# Patient Record
Sex: Female | Born: 1960 | Race: White | Hispanic: No | Marital: Married | Smoking: Former smoker
Health system: Southern US, Community
[De-identification: ages and names within clinical notes are randomized; demographics above are authoritative.]

## PROBLEM LIST (undated history)

## (undated) DIAGNOSIS — F419 Anxiety disorder, unspecified: Secondary | ICD-10-CM

## (undated) DIAGNOSIS — J302 Other seasonal allergic rhinitis: Secondary | ICD-10-CM

## (undated) DIAGNOSIS — H35042 Retinal micro-aneurysms, unspecified, left eye: Secondary | ICD-10-CM

## (undated) DIAGNOSIS — M810 Age-related osteoporosis without current pathological fracture: Secondary | ICD-10-CM

## (undated) DIAGNOSIS — D320 Benign neoplasm of cerebral meninges: Secondary | ICD-10-CM

## (undated) DIAGNOSIS — M1990 Unspecified osteoarthritis, unspecified site: Secondary | ICD-10-CM

## (undated) DIAGNOSIS — E7800 Pure hypercholesterolemia, unspecified: Secondary | ICD-10-CM

## (undated) DIAGNOSIS — H3589 Other specified retinal disorders: Secondary | ICD-10-CM

## (undated) HISTORY — DX: Unspecified osteoarthritis, unspecified site: M19.90

## (undated) HISTORY — DX: Anxiety disorder, unspecified: F41.9

## (undated) HISTORY — DX: Retinal micro-aneurysms, unspecified, left eye: H35.042

## (undated) HISTORY — DX: Other seasonal allergic rhinitis: J30.2

## (undated) HISTORY — DX: Age-related osteoporosis without current pathological fracture: M81.0

## (undated) HISTORY — DX: Benign neoplasm of cerebral meninges: D32.0

## (undated) HISTORY — DX: Pure hypercholesterolemia, unspecified: E78.00

---
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
## 1982-11-14 HISTORY — PX: OTHER SURGICAL HISTORY: SHX169

## 1998-06-17 ENCOUNTER — Other Ambulatory Visit: Admission: RE | Admit: 1998-06-17 | Discharge: 1998-06-17 | Payer: Self-pay | Admitting: *Deleted

## 1999-09-16 ENCOUNTER — Other Ambulatory Visit: Admission: RE | Admit: 1999-09-16 | Discharge: 1999-09-16 | Payer: Self-pay | Admitting: *Deleted

## 1999-12-10 ENCOUNTER — Other Ambulatory Visit: Admission: RE | Admit: 1999-12-10 | Discharge: 1999-12-10 | Payer: Self-pay | Admitting: *Deleted

## 1999-12-10 ENCOUNTER — Encounter (INDEPENDENT_AMBULATORY_CARE_PROVIDER_SITE_OTHER): Payer: Self-pay | Admitting: Specialist

## 2001-01-25 ENCOUNTER — Other Ambulatory Visit: Admission: RE | Admit: 2001-01-25 | Discharge: 2001-01-25 | Payer: Self-pay | Admitting: *Deleted

## 2002-02-21 ENCOUNTER — Other Ambulatory Visit: Admission: RE | Admit: 2002-02-21 | Discharge: 2002-02-21 | Payer: Self-pay | Admitting: *Deleted

## 2003-03-06 ENCOUNTER — Other Ambulatory Visit: Admission: RE | Admit: 2003-03-06 | Discharge: 2003-03-06 | Payer: Self-pay | Admitting: *Deleted

## 2004-05-19 ENCOUNTER — Other Ambulatory Visit: Admission: RE | Admit: 2004-05-19 | Discharge: 2004-05-19 | Payer: Self-pay | Admitting: *Deleted

## 2005-07-14 ENCOUNTER — Other Ambulatory Visit: Admission: RE | Admit: 2005-07-14 | Discharge: 2005-07-14 | Payer: Self-pay | Admitting: *Deleted

## 2005-10-12 ENCOUNTER — Ambulatory Visit: Payer: Self-pay | Admitting: Internal Medicine

## 2005-10-19 ENCOUNTER — Ambulatory Visit: Payer: Self-pay | Admitting: Internal Medicine

## 2006-07-19 ENCOUNTER — Other Ambulatory Visit: Admission: RE | Admit: 2006-07-19 | Discharge: 2006-07-19 | Payer: Self-pay | Admitting: *Deleted

## 2006-08-28 ENCOUNTER — Other Ambulatory Visit: Admission: RE | Admit: 2006-08-28 | Discharge: 2006-08-28 | Payer: Self-pay | Admitting: *Deleted

## 2007-03-07 ENCOUNTER — Other Ambulatory Visit: Admission: RE | Admit: 2007-03-07 | Discharge: 2007-03-07 | Payer: Self-pay | Admitting: *Deleted

## 2007-04-04 ENCOUNTER — Ambulatory Visit: Payer: Self-pay | Admitting: Internal Medicine

## 2007-04-04 LAB — CONVERTED CEMR LAB
ALT: 9 units/L (ref 0–40)
AST: 19 units/L (ref 0–37)
Albumin: 3.6 g/dL (ref 3.5–5.2)
Alkaline Phosphatase: 40 units/L (ref 39–117)
BUN: 7 mg/dL (ref 6–23)
Basophils Absolute: 0 10*3/uL (ref 0.0–0.1)
Basophils Relative: 0.2 % (ref 0.0–1.0)
Bilirubin, Direct: 0.1 mg/dL (ref 0.0–0.3)
CO2: 27 meq/L (ref 19–32)
Calcium: 8.7 mg/dL (ref 8.4–10.5)
Chloride: 112 meq/L (ref 96–112)
Cholesterol: 215 mg/dL (ref 0–200)
Creatinine, Ser: 0.7 mg/dL (ref 0.4–1.2)
Direct LDL: 140.2 mg/dL
Eosinophils Absolute: 0.1 10*3/uL (ref 0.0–0.6)
Eosinophils Relative: 1.8 % (ref 0.0–5.0)
GFR calc Af Amer: 116 mL/min
GFR calc non Af Amer: 96 mL/min
Glucose, Bld: 90 mg/dL (ref 70–99)
HCT: 36.2 % (ref 36.0–46.0)
HDL: 53.4 mg/dL (ref >39.0)
Hemoglobin: 12.4 g/dL (ref 12.0–15.0)
Lymphocytes Relative: 20.1 % (ref 12.0–46.0)
MCHC: 34.4 g/dL (ref 30.0–36.0)
MCV: 88.5 fL (ref 78.0–100.0)
Monocytes Absolute: 0.5 10*3/uL (ref 0.2–0.7)
Monocytes Relative: 6.6 % (ref 3.0–11.0)
Neutro Abs: 5.4 10*3/uL (ref 1.4–7.7)
Neutrophils Relative %: 71.3 % (ref 43.0–77.0)
Platelets: 320 10*3/uL (ref 150–400)
Potassium: 4 meq/L (ref 3.5–5.1)
RBC: 4.09 M/uL (ref 3.87–5.11)
RDW: 12.2 % (ref 11.5–14.6)
Sodium: 143 meq/L (ref 135–145)
TSH: 0.78 microintl units/mL (ref 0.35–5.50)
Total Bilirubin: 0.7 mg/dL (ref 0.3–1.2)
Total CHOL/HDL Ratio: 4
Total Protein: 6.3 g/dL (ref 6.0–8.3)
Triglycerides: 80 mg/dL (ref 0–149)
VLDL: 16 mg/dL (ref 0–40)
WBC: 7.5 10*3/uL (ref 4.5–10.5)

## 2007-04-11 ENCOUNTER — Ambulatory Visit: Payer: Self-pay | Admitting: Internal Medicine

## 2007-08-07 ENCOUNTER — Other Ambulatory Visit: Admission: RE | Admit: 2007-08-07 | Discharge: 2007-08-07 | Payer: Self-pay | Admitting: *Deleted

## 2008-07-15 ENCOUNTER — Telehealth: Payer: Self-pay | Admitting: Internal Medicine

## 2008-08-11 ENCOUNTER — Other Ambulatory Visit: Admission: RE | Admit: 2008-08-11 | Discharge: 2008-08-11 | Payer: Self-pay | Admitting: Obstetrics & Gynecology

## 2012-09-27 ENCOUNTER — Encounter: Payer: Self-pay | Admitting: Internal Medicine

## 2012-11-12 ENCOUNTER — Encounter: Payer: Self-pay | Admitting: Internal Medicine

## 2012-11-12 ENCOUNTER — Ambulatory Visit (AMBULATORY_SURGERY_CENTER): Payer: Commercial Managed Care - PPO | Admitting: *Deleted

## 2012-11-12 VITALS — Ht 67.0 in | Wt 139.8 lb

## 2012-11-12 DIAGNOSIS — Z1211 Encounter for screening for malignant neoplasm of colon: Secondary | ICD-10-CM

## 2012-11-12 MED ORDER — MOVIPREP 100 G PO SOLR: ORAL | Status: DC

## 2012-11-14 HISTORY — PX: COLONOSCOPY: SHX174

## 2012-11-23 ENCOUNTER — Encounter: Payer: Self-pay | Admitting: Internal Medicine

## 2012-12-03 ENCOUNTER — Ambulatory Visit (AMBULATORY_SURGERY_CENTER): Payer: Commercial Managed Care - PPO | Admitting: Internal Medicine

## 2012-12-03 ENCOUNTER — Encounter: Payer: Self-pay | Admitting: Internal Medicine

## 2012-12-03 VITALS — BP 122/75 | HR 50 | Temp 98.5°F | Resp 19 | Ht 67.0 in | Wt 139.0 lb

## 2012-12-03 DIAGNOSIS — Z1211 Encounter for screening for malignant neoplasm of colon: Secondary | ICD-10-CM

## 2012-12-03 MED ORDER — SODIUM CHLORIDE 0.9 % IV SOLN: 500.0000 mL | INTRAVENOUS | Status: DC

## 2012-12-03 NOTE — Progress Notes (Signed)
Patient did not experience any of the following events: a burn prior to discharge; a fall within the facility; wrong site/side/patient/procedure/implant event; or a hospital transfer or hospital admission upon discharge from the facility. (G8907) Patient did not have preoperative order for IV antibiotic SSI prophylaxis. (G8918)  

## 2012-12-03 NOTE — Patient Instructions (Addendum)
YOU HAD AN ENDOSCOPIC PROCEDURE TODAY AT THE [facility]: Refer to the procedure report that was given to you for any specific questions about what was found during the examination.  If the procedure report does not answer your questions, please call your gastroenterologist to clarify.  If you requested that your care partner not be given the details of your procedure findings, then the procedure report has been included in a sealed envelope for you to review at your convenience later.  YOU SHOULD EXPECT: Some feelings of bloating in the abdomen. Passage of more gas than usual.  Walking can help get rid of the air that was put into your GI tract during the procedure and reduce the bloating. If you had a lower endoscopy (such as a colonoscopy or flexible sigmoidoscopy) you may notice spotting of blood in your stool or on the toilet paper. If you underwent a bowel prep for your procedure, then you may not have a normal bowel movement for a few days.  DIET: Your first meal following the procedure should be a light meal and then it is ok to progress to your normal diet.  A half-sandwich or bowl of soup is an example of a good first meal.  Heavy or fried foods are harder to digest and may make you feel nauseous or bloated.  Likewise meals heavy in dairy and vegetables can cause extra gas to form and this can also increase the bloating.  Drink plenty of fluids but you should avoid alcoholic beverages for 24 hours.  ACTIVITY: Your care partner should take you home directly after the procedure.  You should plan to take it easy, moving slowly for the rest of the day.  You can resume normal activity the day after the procedure however you should NOT DRIVE or use heavy machinery for 24 hours (because of the sedation medicines used during the test).    SYMPTOMS TO REPORT IMMEDIATELY: A gastroenterologist can be reached at any hour.  During normal business hours, 8:30 AM to 5:00 PM Monday through Friday,  call [phone].  After hours and on weekends, please call the GI answering service at [phone] who will take a message and have the physician on call contact you.   Following lower endoscopy (colonoscopy or flexible sigmoidoscopy):  Excessive amounts of blood in the stool  Significant tenderness or worsening of abdominal pains  Swelling of the abdomen that is new, acute  Fever of 100F or higher    FOLLOW UP: If any biopsies were taken you will be contacted by phone or by letter within the next 1-3 weeks.  Call your gastroenterologist if you have not heard about the biopsies in 3 weeks.  Our staff will call the home number listed on your records the next business day following your procedure to check on you and address any questions or concerns that you may have at that time regarding the information given to you following your procedure. This is a courtesy call and so if there is no answer at the home number and we have not heard from you through the emergency physician on call, we will assume that you have returned to your regular daily activities without incident.  SIGNATURES/CONFIDENTIALITY: You and/or your care partner have signed paperwork which will be entered into your electronic medical record.  These signatures attest to the fact that that the information above on your After Visit Summary has been reviewed and is understood.  Full responsibility of the confidentiality   of this discharge information lies with you and/or your care-partner.     

## 2012-12-03 NOTE — Op Note (Signed)
[facility] [address] [place], [address]   COLONOSCOPY PROCEDURE REPORT  PATIENT: Suzanne Carter, Suzanne Carter  MR#: [id number] BIRTHDATE: 1961/02/28 , 51  yrs. old GENDER: Female ENDOSCOPIST: Roxy Cedar, MD REFERRED VO:[id number] Avva, M.D. PROCEDURE DATE:  12/03/2012 PROCEDURE:   Colonoscopy, screening ASA CLASS:   Class II INDICATIONS:average risk screening. MEDICATIONS: MAC sedation, administered by CRNA and propofol (Diprivan) 250mg  IV  DESCRIPTION OF PROCEDURE:   After the risks benefits and alternatives of the procedure were thoroughly explained, informed consent was obtained.  A digital rectal exam revealed no abnormalities of the rectum.   The LB [id number]-[id number]  endoscope was introduced through the anus and advanced to the cecum, which was identified by both the appendix and ileocecal valve. No adverse events experienced.   The quality of the prep was excellent, using MoviPrep  The instrument was then slowly withdrawn as the colon was fully examined.      COLON FINDINGS: The mucosa appeared normal in the terminal ileum. A normal appearing cecum, ileocecal valve, and appendiceal orifice were identified.  The ascending, hepatic flexure, transverse, splenic flexure, descending, sigmoid colon and rectum appeared unremarkable.  No polyps or cancers were seen.  Retroflexed views revealed no abnormalities. The time to cecum=5 minutes 0 seconds. Withdrawal time=11 minutes 40 seconds.  The scope was withdrawn and the procedure completed. COMPLICATIONS: There were no complications.  ENDOSCOPIC IMPRESSION: 1.   Normal mucosa in the terminal ileum 2.   Normal colon  RECOMMENDATIONS: 1. Continue current colorectal screening recommendations for "routine risk" patients with a repeat colonoscopy in 10 years.   eSigned:  Roxy Cedar, MD 12/03/2012 10:40 AM   cc: Chilton Greathouse, MD and The Patient

## 2012-12-04 ENCOUNTER — Telehealth: Payer: Self-pay | Admitting: *Deleted

## 2012-12-04 NOTE — Telephone Encounter (Signed)
Left message on f/u callback 

## 2013-11-25 ENCOUNTER — Encounter: Payer: Self-pay | Admitting: Nurse Practitioner

## 2013-11-25 ENCOUNTER — Ambulatory Visit (INDEPENDENT_AMBULATORY_CARE_PROVIDER_SITE_OTHER): Payer: Managed Care, Other (non HMO) | Admitting: Nurse Practitioner

## 2013-11-25 VITALS — BP 120/70 | HR 60 | Ht 67.0 in | Wt 140.0 lb

## 2013-11-25 DIAGNOSIS — Z01419 Encounter for gynecological examination (general) (routine) without abnormal findings: Secondary | ICD-10-CM

## 2013-11-25 MED ORDER — NORGESTIMATE-ETH ESTRADIOL 0.25-35 MG-MCG PO TABS: 1.0000 | ORAL_TABLET | Freq: Every day | ORAL | Status: DC

## 2013-11-25 MED ORDER — ESTRADIOL 0.1 MG/24HR TD PTTW: 1.0000 | MEDICATED_PATCH | TRANSDERMAL | Status: DC

## 2013-11-25 NOTE — Patient Instructions (Signed)

## 2013-11-25 NOTE — Progress Notes (Signed)
Patient ID: Suzanne Carter, female   DOB: [birth year], 53 y.o.   MRN: [id number] 52 y.o. G21P2002 Married Caucasian Fe here for annual exam.  Takes continuous OCP and has a withdrawal bleed every 4-5 months.  On off week will use Vivelle patch X 2 doses.  When gets withdrawal menses at 5 days moderate to light.  Patient's last menstrual period was 08/26/2013.          Sexually active: yes  The current method of family planning is OCP (estrogen/progesterone).    Exercising: yes  Gym/ health club routine includes cardio and light weights.  Twice weekly. Smoker:  no  Health Maintenance: Pap:  11/20/12, WNL, neg HR HPV MMG:  09/10/13, Bi-Rads 1: negative Colonoscopy:  12/03/12, normal, repeat in 10 years BMD:   Never, scheduled for 12/2013 TDaP:  2008  Labs: PCP   reports that she quit smoking about 19 years ago. She has never used smokeless tobacco. She reports that she drinks alcohol. She reports that she does not use illicit drugs.  Past Medical History  Diagnosis Date  . Seasonal allergies     Past Surgical History  Procedure Laterality Date  . Wisdom teeth removal  1984  . Tonsillectomy  1977    Current Outpatient Prescriptions  Medication Sig Dispense Refill  . Cetirizine HCl (KLS ALLER-TEC PO) Take by mouth daily.      . Cholecalciferol (VITAMIN D) 2000 UNITS CAPS Take by mouth daily.      . norgestimate-ethinyl estradiol (ORTHO-CYCLEN,SPRINTEC,PREVIFEM) 0.25-35 MG-MCG tablet Take 1 tablet by mouth daily.      . Omega-3 Fatty Acids (OMEGA 3 PO) Take by mouth.       No current facility-administered medications for this visit.    Family History  Problem Relation Age of Onset  . Colon cancer Neg Hx   . Stomach cancer Neg Hx   . Heart disease Father   . Cancer Father   . Breast cancer Sister     lung  . Osteoporosis Mother     ROS:  Pertinent items are noted in HPI.  Otherwise, a comprehensive ROS was negative.  Exam:   BP 120/70  Pulse 60  Ht 5\' 7"  (1.702 m)  Wt 140  lb (63.504 kg)  BMI 21.92 kg/m2  LMP 08/26/2013 Height: 5\' 7"  (170.2 cm)  Ht Readings from Last 3 Encounters:  11/25/13 5\' 7"  (1.702 m)  12/03/12 5\' 7"  (1.702 m)  11/12/12 5\' 7"  (1.702 m)    General appearance: alert, cooperative and appears stated age Head: Normocephalic, without obvious abnormality, atraumatic Neck: no adenopathy, supple, symmetrical, trachea midline and thyroid normal to inspection and palpation Lungs: clear to auscultation bilaterally Breasts: normal appearance, no masses or tenderness Heart: regular rate and rhythm Abdomen: soft, non-tender; no masses,  no organomegaly Extremities: extremities normal, atraumatic, no cyanosis or edema Skin: Skin color, texture, turgor normal. No rashes or lesions Lymph nodes: Cervical, supraclavicular, and axillary nodes normal. No abnormal inguinal nodes palpated Neurologic: Grossly normal   Pelvic: External genitalia:  no lesions              Urethra:  normal appearing urethra with no masses, tenderness or lesions              Bartholin's and Skene's: normal                 Vagina: normal appearing vagina with normal color and discharge, no lesions  Cervix: anteverted              Pap taken: no Bimanual Exam:  Uterus:  normal size, contour, position, consistency, mobility, non-tender              Adnexa: no mass, fullness, tenderness               Rectovaginal: Confirms               Anus:  normal sphincter tone, no lesions  A:  Well Woman with normal exam   OCP for cycle regulation and headaches  Husband with vasectomy  P:   Pap smear as per guidelines   Mammogram due 10/15  Refill Vivelle dot and OCP for a year  Plan is to come off OCP in 1 1/2 year.  Counseled on breast self exam, mammography screening, adequate intake of calcium and vitamin D, diet and exercise return annually or prn  An After Visit Summary was printed and given to the patient.

## 2013-11-28 NOTE — Progress Notes (Signed)
Encounter reviewed by Dr. Samin Milke Silva.  

## 2014-01-02 ENCOUNTER — Other Ambulatory Visit: Payer: Self-pay | Admitting: Nurse Practitioner

## 2014-01-02 ENCOUNTER — Telehealth: Payer: Self-pay | Admitting: Nurse Practitioner

## 2014-01-02 NOTE — Telephone Encounter (Signed)
Patient sent a list of now generic OCP for her new insurance company.  I called her to discuss and she had an off week of OCP on 2/6 and did not have a withdrawal bleed.  Since she did not have a menses she may not even need OCP (used for menorrhagia not birth control).  So we discussed going back off OCP after this current pack of pills and see how she does with an irregular bleeding, increase in HA's or other symptoms.  If she does have problems the Necon 0.5/35 is probably the closest to what she is on now and we can send in new RX.  She will give it some thought and time and see how she does. The list of OCP is on her paper chart.

## 2014-02-13 ENCOUNTER — Other Ambulatory Visit: Payer: Self-pay | Admitting: Nurse Practitioner

## 2014-02-13 ENCOUNTER — Telehealth: Payer: Self-pay | Admitting: Nurse Practitioner

## 2014-02-13 MED ORDER — NORETHINDRONE-ETH ESTRADIOL 0.5-35 MG-MCG PO TABS: 1.0000 | ORAL_TABLET | Freq: Every day | ORAL | Status: DC

## 2014-02-13 NOTE — Telephone Encounter (Signed)
Yes she can restart on Necon 0.5/30.  I will place the order. Thanks

## 2014-02-13 NOTE — Telephone Encounter (Signed)
Reviewed last telephone encounter from 2/19 with Milford Cage, [place]. Patient was to stop taking OCP to see how she does with irregular bleeding. Patient's LMP 3/9 which lasted a week but she has now started having some spotting. Milford Cage, FNP recommended Necon 0.5/35 if she had any problems.  Milford Cage, FNP, would you like me to place order for Necon 0.5/35 at this time?

## 2014-02-13 NOTE — Telephone Encounter (Signed)
Spoke with patient. Advised she can start Necon 0.5/30 and that [facility]. Patient agreeable and verbalizes understanding.  Routing to provider for final review. Patient agreeable to disposition. Will close encounter

## 2014-02-13 NOTE — Telephone Encounter (Signed)
Patient is calling patty to let her know what her cycle started 01/20/14 and lasted a week. And has started spotting this week. Said patty was goning give her a different pill

## 2014-05-13 ENCOUNTER — Telehealth: Payer: Self-pay

## 2014-05-13 NOTE — Telephone Encounter (Signed)
Pt has enough refill until her next AEX.  Encounter closed

## 2014-05-14 ENCOUNTER — Telehealth: Payer: Self-pay

## 2014-05-14 MED ORDER — NORETHINDRONE-ETH ESTRADIOL 0.5-35 MG-MCG PO TABS: 1.0000 | ORAL_TABLET | Freq: Every day | ORAL | Status: DC

## 2014-05-14 NOTE — Telephone Encounter (Signed)
Received a fax from [facility] for [facility] refills.  Last refill: 02/13/14 dispense 3 packs 3 refill Pt had enough to cover until next AEX in 11/2014 Called pharm to make sure pt had enough refills because I received a fax form pharm yesterday. [facility] stated her INS only covers for a 1 mth supply at a time. [facility] to cover [facility] until next AEX  Encounter closed

## 2014-09-15 ENCOUNTER — Encounter: Payer: Self-pay | Admitting: Nurse Practitioner

## 2014-11-26 ENCOUNTER — Encounter: Payer: Self-pay | Admitting: Nurse Practitioner

## 2014-11-26 ENCOUNTER — Ambulatory Visit (INDEPENDENT_AMBULATORY_CARE_PROVIDER_SITE_OTHER): Payer: 59 | Admitting: Nurse Practitioner

## 2014-11-26 VITALS — BP 136/78 | HR 60 | Ht 67.0 in | Wt 142.0 lb

## 2014-11-26 DIAGNOSIS — Z01419 Encounter for gynecological examination (general) (routine) without abnormal findings: Secondary | ICD-10-CM

## 2014-11-26 DIAGNOSIS — Z0000 Encounter for general adult medical examination without abnormal findings: Secondary | ICD-10-CM

## 2014-11-26 MED ORDER — NORETHINDRONE-ETH ESTRADIOL 0.5-35 MG-MCG PO TABS: 1.0000 | ORAL_TABLET | Freq: Every day | ORAL | Status: DC

## 2014-11-26 MED ORDER — ESTRADIOL 0.1 MG/24HR TD PTTW: 1.0000 | MEDICATED_PATCH | TRANSDERMAL | Status: DC

## 2014-11-26 NOTE — Patient Instructions (Signed)

## 2014-11-26 NOTE — Progress Notes (Signed)
Patient ID: Suzanne Carter, female   DOB: Mar 25, 1961, 54 y.o.   MRN: [id number] 53 y.o. G2P2002 Married  Caucasian Fe here for annual exam.  No menses since October. No recent headaches.  Usually off OCP the headaches will start and be very severe.  Really reluctant to come off OCP.  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  Patient's last menstrual period was 08/14/2014 (approximate).          Sexually active: yes  The current method of family planning is OCP (estrogen/progesterone).  Exercising: yes Gym/ health club routine includes cardio and light weights. Twice weekly. Smoker: no  Health Maintenance: Pap: 11/20/12, WNL, neg HR HPV MMG: 09/15/14, Bi-Rads 1: negative Colonoscopy: 12/03/12, normal, repeat in 10 years BMD:09/2014, Dr. Radene Gunning, normal TDaP: 2008 Labs: PCP   reports that she quit smoking about 20 years ago. Her smoking use included Cigarettes. She has a 3.5 pack-year smoking history. She has never used smokeless tobacco. She reports that she drinks alcohol. She reports that she does not use illicit drugs.  Past Medical History  Diagnosis Date  . Seasonal allergies     Past Surgical History  Procedure Laterality Date  . Wisdom teeth removal  1984  . Tonsillectomy  1977    Current Outpatient Prescriptions  Medication Sig Dispense Refill  . Cholecalciferol (VITAMIN D) 2000 UNITS CAPS Take by mouth daily.    Marland Kitchen estradiol (VIVELLE-DOT) 0.1 MG/24HR patch Place 1 patch (0.1 mg total) onto the skin 2 (two) times a week. 8 patch 12  . norethindrone-ethinyl estradiol (NECON,BREVICON,MODICON) 0.5-35 MG-MCG tablet Take 1 tablet by mouth daily. 3 Package 3  . Omega-3 Fatty Acids (OMEGA 3 PO) Take by mouth.    . SUMAtriptan (IMITREX) 100 MG tablet Take 100 mg by mouth daily as needed.  11   No current facility-administered medications for this visit.     Family History  Problem Relation Age of Onset  . Colon cancer Neg Hx   . Stomach cancer Neg Hx   . Heart disease Father   . Lung cancer Father 1  . Osteoporosis Mother   . Diverticulitis Brother   . Ovarian cancer Paternal Aunt   . Ovarian cancer Paternal Aunt   . Cancer Maternal Grandfather     esophageal cancer  . Lung cancer Paternal Grandfather     ROS:  Pertinent items are noted in HPI.  Otherwise, a comprehensive ROS was negative.  Exam:   BP 136/78 mmHg  Pulse 60  Ht 5\' 7"  (1.702 m)  Wt 142 lb (64.411 kg)  BMI 22.24 kg/m2  LMP 08/14/2014 (Approximate) Height: 5\' 7"  (170.2 cm) Ht Readings from Last 3 Encounters:  11/26/14 5\' 7"  (1.702 m)  11/25/13 5\' 7"  (1.702 m)  12/03/12 5\' 7"  (1.702 m)    General appearance: alert, cooperative and appears stated age Head: Normocephalic, without obvious abnormality, atraumatic Neck: no adenopathy, supple, symmetrical, trachea midline and thyroid normal to inspection and palpation Lungs: clear to auscultation bilaterally Breasts: normal appearance, no masses or tenderness Heart: regular rate and rhythm Abdomen: soft, non-tender; no masses,  no organomegaly Extremities: extremities normal, atraumatic, no cyanosis or edema Skin: Skin color, texture, turgor normal. No rashes or lesions Lymph nodes: Cervical, supraclavicular, and axillary nodes normal. No abnormal inguinal nodes palpated Neurologic: Grossly normal   Pelvic: External genitalia:  no lesions  Urethra:  normal appearing urethra with no masses, tenderness or lesions              Bartholin's and Skene's: normal                 Vagina: normal appearing vagina with normal color and discharge, no lesions              Cervix: anteverted              Pap taken: No. Bimanual Exam:  Uterus:  normal size, contour, position, consistency, mobility, non-tender              Adnexa: no mass, fullness, tenderness               Rectovaginal: Confirms                Anus:  normal sphincter tone, no lesions  Chaperone present: no  A:  Well Woman with normal exam  OCP for cycle regulation and headaches Husband with vasectomy   P:   Reviewed health and wellness pertinent to exam  Pap smear not taken today  Mammogram is due 11/16  Refill OCP for a year  Refill Vivelle dot for a year  Counseled on breast self exam, mammography screening, use and side effects of OCP's, adequate intake of calcium and vitamin D, diet and exercise return annually or prn  An After Visit Summary was printed and given to the patient.

## 2014-11-27 NOTE — Progress Notes (Signed)
OK to continue OCPs but would lower to 69mcg dosage.  Reviewed personally.  Felipa Emory, MD.

## 2014-11-28 ENCOUNTER — Telehealth: Payer: Self-pay | Admitting: *Deleted

## 2014-11-28 NOTE — Telephone Encounter (Signed)
I spoke to the patient and notified of need to decrease dose to 20 mcg pill per Dr. Sabra Heck and Edman Circle.  Pt is agreeable to this plan. Pt states current OCP is free with her insurance. Pt will fax a list to my attention of free OCP options. Advised pt I will be out of the office this afternoon and Monday, but will make Edman Circle, FNP aware fax is coming so we can send in new RX.  Advised pt we will call her if we have any questions or problems.  Pt has two weeks of pills left in current pill pack.  [facility].  Please enter new RX when fax is received.

## 2014-12-01 ENCOUNTER — Other Ambulatory Visit: Payer: Self-pay | Admitting: Nurse Practitioner

## 2014-12-01 MED ORDER — NORETHIN ACE-ETH ESTRAD-FE 1-20 MG-MCG PO TABS: 1.0000 | ORAL_TABLET | Freq: Every day | ORAL | Status: DC

## 2014-12-01 NOTE — Telephone Encounter (Signed)
Did you call her?

## 2014-12-01 NOTE — Telephone Encounter (Signed)
According to telephone note (see below) patient was to be notified as soon as rx had been sent in after fax was sent to our office with different BC options that would be free with her insurance.  Graylon Good, CMA at 11/28/2014 9:20 AM     Status: Signed       Expand All Collapse All   I spoke to the patient and notified of need to decrease dose to 20 mcg pill per Dr. Sabra Heck and Edman Circle. Pt is agreeable to this plan. Pt states current OCP is free with her insurance. Pt will fax a list to my attention of free OCP options. Advised pt I will be out of the office this afternoon and Monday, but will make Edman Circle, FNP aware fax is coming so we can send in new RX. Advised pt we will call her if we have any questions or problems.  Pt has two weeks of pills left in current pill pack.  [facility].  Please enter new RX when fax is received      Ms. Patty sent in Norethindrone-ethinyl-estradiol 1-20 mcg  ([employer] [place], [place], Loestrin) #3 packs[facility], patient is aware.  Routed to provider for review, encounter closed.

## 2014-12-02 NOTE — Telephone Encounter (Signed)
Yes ma'am I called and notified her yesterday.

## 2015-12-01 ENCOUNTER — Ambulatory Visit (INDEPENDENT_AMBULATORY_CARE_PROVIDER_SITE_OTHER): Payer: 59 | Admitting: Nurse Practitioner

## 2015-12-01 ENCOUNTER — Encounter: Payer: Self-pay | Admitting: Nurse Practitioner

## 2015-12-01 VITALS — BP 130/82 | HR 64 | Ht 67.0 in | Wt 142.0 lb

## 2015-12-01 DIAGNOSIS — N912 Amenorrhea, unspecified: Secondary | ICD-10-CM

## 2015-12-01 DIAGNOSIS — R87619 Unspecified abnormal cytological findings in specimens from cervix uteri: Secondary | ICD-10-CM | POA: Diagnosis not present

## 2015-12-01 DIAGNOSIS — N63 Unspecified lump in breast: Secondary | ICD-10-CM

## 2015-12-01 DIAGNOSIS — Z0000 Encounter for general adult medical examination without abnormal findings: Secondary | ICD-10-CM | POA: Diagnosis not present

## 2015-12-01 DIAGNOSIS — N6310 Unspecified lump in the right breast, unspecified quadrant: Secondary | ICD-10-CM

## 2015-12-01 DIAGNOSIS — Z01419 Encounter for gynecological examination (general) (routine) without abnormal findings: Secondary | ICD-10-CM | POA: Diagnosis not present

## 2015-12-01 NOTE — Progress Notes (Signed)
Patient ID: Suzanne Carter, female   DOB: 04-19-1961, 55 y.o.   MRN: [id number]  55 y.o. G19P2002 Married  Caucasian Fe here for annual exam.  She decided to go off OCP last April after knowing she would be taken off them this year.  She was quite happy that she did not have another menses and most importantly - NO HA's.  She has had a bit of vaso symptoms - at times nights worse than day.  Does have sleep interruptions.  She also has decreased libido and increased vaginal dryness - does use lubrication.  Some increase in muscle and joint pain - PCP has done some labs to R/O connective tissue disorder and so far all is normal. Her best friend age 93 died of colon cancer this past year.  Patient's last menstrual period was 08/14/2014 (approximate).          Sexually active: Yes.    The current method of family planning is post menopausal status.   Exercising: Yes.    treadmill and weights at gym twice weekly Smoker:  no  Health Maintenance: Pap: 11/20/12, WNL, neg HR HPV MMG:09/18/15, Bi-Rads 1: negative Colonoscopy: 12/03/12, normal, repeat in 10 years BMD:09/2014, Dr. Radene Gunning, normal TDaP: 2008 Hep C and HIV: will do today Labs: Dr. Dagmar Hait   reports that she quit smoking about 21 years ago. Her smoking use included Cigarettes. She has a 3.5 pack-year smoking history. She has never used smokeless tobacco. She reports that she drinks alcohol. She reports that she does not use illicit drugs.  Past Medical History  Diagnosis Date  . Seasonal allergies     Past Surgical History  Procedure Laterality Date  . Wisdom teeth removal  1984  . Tonsillectomy  1977    Current Outpatient Prescriptions  Medication Sig Dispense Refill  . Cholecalciferol (VITAMIN D) 2000 UNITS CAPS Take by mouth daily.    . Omega-3 Fatty Acids (OMEGA 3 PO) Take by mouth.    . RESTASIS 0.05 % ophthalmic emulsion Place 1 drop into both eyes 2 (two) times daily.    . SUMAtriptan (IMITREX) 100 MG tablet Take 100 mg by  mouth daily as needed.  11   No current facility-administered medications for this visit.    Family History  Problem Relation Age of Onset  . Colon cancer Neg Hx   . Stomach cancer Neg Hx   . Heart disease Father   . Lung cancer Father 80  . Osteoporosis Mother   . Diverticulitis Brother   . Ovarian cancer Paternal Aunt   . Ovarian cancer Paternal Aunt   . Cancer Maternal Grandfather     esophageal cancer  . Lung cancer Paternal Grandfather     ROS:  Pertinent items are noted in HPI.  Otherwise, a comprehensive ROS was negative.  Exam:   BP 130/82 mmHg  Pulse 64  Ht 5\' 7"  (1.702 m)  Wt 142 lb (64.411 kg)  BMI 22.24 kg/m2  LMP 08/14/2014 (Approximate) Height: 5\' 7"  (170.2 cm) Ht Readings from Last 3 Encounters:  12/01/15 5\' 7"  (1.702 m)  11/26/14 5\' 7"  (1.702 m)  11/25/13 5\' 7"  (1.702 m)    General appearance: alert, cooperative and appears stated age Head: Normocephalic, without obvious abnormality, atraumatic Neck: no adenopathy, supple, symmetrical, trachea midline and thyroid normal to inspection and palpation Lungs: clear to auscultation bilaterally Breasts: normal appearance, no masses or tenderness, positive findings: there is a cluster of FCB changes or mass right breast at 4:00 position.  This is non mobile but not tender or warm.  No lymph nodes.  Left breast with similiar location but they are mobile. Heart: regular rate and rhythm Abdomen: soft, non-tender; no masses,  no organomegaly Extremities: extremities normal, atraumatic, no cyanosis or edema Skin: Skin color, texture, turgor normal. No rashes or lesions Lymph nodes: Cervical, supraclavicular, and axillary nodes normal. No abnormal inguinal nodes palpated Neurologic: Grossly normal   Pelvic: External genitalia:  no lesions              Urethra:  normal appearing urethra with no masses, tenderness or lesions              Bartholin's and Skene's: normal                 Vagina: normal appearing  vagina with normal color and discharge, no lesions              Cervix: anteverted              Pap taken: Yes.   Bimanual Exam:  Uterus:  normal size, contour, position, consistency, mobility, non-tender              Adnexa: no mass, fullness, tenderness               Rectovaginal: Confirms               Anus:  normal sphincter tone, no lesions  Chaperone present: yes  A:  Well Woman with normal exam Husband with vasectomy  Amenorrhea while on OCP - off OCP since 02/2015 with amenorrhea  Tolerable vaso symptoms - does not want HRT    P:   Reviewed health and wellness pertinent to exam  Pap smear as above  Mammogram is due 09/2016 - but will need to get diagnostic Mammo and [place] now to assess the mass on the right.  Will follow with labs  Only brief discussion about HRT and pt is not interested and does not ever want  Counseled on breast self exam, mammography screening, adequate intake of calcium and vitamin D, diet and exercise, Kegel's exercises return annually or prn  An After Visit Summary was printed and given to the patient.

## 2015-12-01 NOTE — Patient Instructions (Addendum)

## 2015-12-02 ENCOUNTER — Telehealth: Payer: Self-pay

## 2015-12-02 DIAGNOSIS — N6310 Unspecified lump in the right breast, unspecified quadrant: Secondary | ICD-10-CM

## 2015-12-02 LAB — HIV ANTIBODY (ROUTINE TESTING W REFLEX): HIV 1&2 Ab, 4th Generation: NONREACTIVE

## 2015-12-02 LAB — HEPATITIS C ANTIBODY: HCV Ab: NEGATIVE

## 2015-12-02 LAB — FOLLICLE STIMULATING HORMONE: FSH: 122.1 m[IU]/mL — ABNORMAL HIGH

## 2015-12-02 NOTE — Telephone Encounter (Signed)
Spoke with [employer]. Right diagnostic mammogram and ultrasound scheduled for 12/03/2015 at 8:15 am. Notified patient of appointment date and time. Patient is agreeable. Placed in mammogram hold.  Routing to provider for final review. Patient agreeable to disposition. Will close encounter.

## 2015-12-02 NOTE — Progress Notes (Signed)
Encounter reviewed by Dr. Brook Amundson C. Silva.  

## 2015-12-07 LAB — IPS PAP TEST WITH HPV

## 2015-12-07 NOTE — Addendum Note (Signed)
Addended by: Antonietta Barcelona on: 12/07/2015 01:34 PM   Modules accepted: Orders

## 2015-12-08 LAB — IPS HPV GENOTYPING 16/18

## 2015-12-11 ENCOUNTER — Telehealth: Payer: Self-pay | Admitting: Emergency Medicine

## 2015-12-11 NOTE — Telephone Encounter (Signed)
Pt is called back and I have discussed the abnormal pap.  She had both pap and HR HPV done in 11/2012 and both were negative.  She now has + HR HPV but the genotype for 16,178, 45 were negative.  She is aware that other illness states could have caused this to now be positive.  She is agreeable to rechecking a pap in 1 year along with HR HPV.

## 2015-12-11 NOTE — Telephone Encounter (Signed)
-----   Message from Kem Boroughs, [place] sent at 12/08/2015  4:58 PM EST ----- Please inform pt that her pap was normal but that HR HPV was positive.  She was then tested for genotype # 16, 18 & 4 and was normal.  She needs another pap in a year. 08 recall. No history of abnormal that I am aware.

## 2015-12-11 NOTE — Telephone Encounter (Signed)
Call to patient. She is given results of normal pap smear and positive HPV.  Patient has concerns regarding infection. Not ever positive for HPV or abnormal pap smear.  Married 35 years with one partner only.   Advised will ask Kem Boroughs, FNP to call patient directly.   Patient agreeable.

## 2016-07-21 ENCOUNTER — Telehealth: Payer: Self-pay | Admitting: *Deleted

## 2016-07-21 DIAGNOSIS — R87810 Cervical high risk human papillomavirus (HPV) DNA test positive: Secondary | ICD-10-CM

## 2016-07-21 NOTE — Telephone Encounter (Signed)
Call to patient. Advised internal audit completed and MD reviewed pap results and colpo recommended. Brief explanation of procedure provided. Colpo scheduled for 07-28-16 with Dr Sabra Heck. Instructed to take Motrin 800 mg one hour prior with food.   Routing to provider for final review. Patient agreeable to disposition. Will close encounter.

## 2016-07-25 ENCOUNTER — Telehealth: Payer: Self-pay | Admitting: Obstetrics & Gynecology

## 2016-07-25 NOTE — Telephone Encounter (Signed)
Patient has an appointment for a colposcopy 07/28/16 and is wondering how she may feel after the procedure.

## 2016-07-25 NOTE — Telephone Encounter (Signed)
Spoke with patient. Patient is scheduled to have a colposcopy on 07/28/2016 at 10 am with Dr.Miller. Patient is asking how she can expect to feel after her procedure. Advised patient following a colposcopy it is not uncommon to have cramping and light bleeding. Advised she may take Ibuprofen/Motrin as needed for cramping if needed following the procedure. Advised light spotting usually lasts for 2-3 days. Advised she may resume her normal daily activities after her procedure, but may want to take it easy if she is able to due to cramping she may experience following the procedure. She is agreeable and verbalizes understanding.  Routing to provider for final review. Patient agreeable to disposition. Will close encounter.

## 2016-07-28 ENCOUNTER — Ambulatory Visit (INDEPENDENT_AMBULATORY_CARE_PROVIDER_SITE_OTHER): Payer: 59 | Admitting: Obstetrics & Gynecology

## 2016-07-28 DIAGNOSIS — R87810 Cervical high risk human papillomavirus (HPV) DNA test positive: Secondary | ICD-10-CM | POA: Diagnosis not present

## 2016-07-28 NOTE — Progress Notes (Signed)
55 y.o. Married Caucasian female here for colposcopy with possible biopsies and/or ECC due to neg Pap with +HR HPV and positive 18/45 testing obtained 12/01/15.  Pt's abnormal finding was initially reported as positive but in our audit, this was noted.  Pt was notified and she is here for recommended colposcopy.    Prior evaluation/treatment:  none.  Patient's last menstrual period was 08/14/2014 (approximate).          Sexually active: Yes.    The current method of family planning is post menopausal status.     Patient has been counseled about results and procedure.  Risks and benefits have bene reviewed including immediate and/or delayed bleeding, infection, cervical scaring from procedure, possibility of needing additional follow up as well as treatment.  rare risks of missing a lesion discussed as well.  All questions answered.  Pt ready to proceed.  BP 124/70 (BP Location: Right Arm, Patient Position: Sitting, Cuff Size: Normal)   Pulse 76   Temp 97.9 F (36.6 C) (Oral)   Resp 16   Ht 5\' 7"  (1.702 m)   Wt 138 lb (62.6 kg)   LMP 08/14/2014 (Approximate)   BMI 21.61 kg/m   Physical Exam  Constitutional: She is oriented to person, place, and time. She appears well-developed and well-nourished.  Genitourinary: Vagina normal. There is no rash, tenderness, lesion or injury on the right labia. There is no rash, tenderness, lesion or injury on the left labia.    Lymphadenopathy:       Right: No inguinal adenopathy present.       Left: No inguinal adenopathy present.  Neurological: She is alert and oriented to person, place, and time.  Skin: Skin is warm and dry.  Psychiatric: She has a normal mood and affect.    Speculum placed.  3% acetic acid applied to cervix for >45 seconds.  Cervix visualized with both 7.5X and 15X magnification.  Green filter also used.  Lugols solution was used.  Findings:  Erythematous lesion at 7-9 o'clock that also has decreased staining with Lugol's.  In  addition, a second lesion with decrease staining noted at 5 o'clock.  Biopsy:  7 and 5 o'clock.  ECC:  was performed.  Monsel's was needed.  Excellent hemostasis was present.  Pt tolerated procedure well and all instruments were removed.  Findings noted above on picture of cervix.  Assessment:  +HR HPV, +18/45 testing  Plan:  Pathology results will be called to patient and follow-up planned pending results.

## 2016-07-28 NOTE — Patient Instructions (Signed)

## 2016-08-01 ENCOUNTER — Other Ambulatory Visit: Payer: Self-pay | Admitting: Optometry

## 2016-08-01 DIAGNOSIS — H0520 Unspecified exophthalmos: Secondary | ICD-10-CM

## 2016-08-01 DIAGNOSIS — H53482 Generalized contraction of visual field, left eye: Secondary | ICD-10-CM

## 2016-08-01 DIAGNOSIS — H3550 Unspecified hereditary retinal dystrophy: Secondary | ICD-10-CM

## 2016-08-01 DIAGNOSIS — H4720 Unspecified optic atrophy: Secondary | ICD-10-CM

## 2016-08-02 ENCOUNTER — Ambulatory Visit (INDEPENDENT_AMBULATORY_CARE_PROVIDER_SITE_OTHER): Payer: 59

## 2016-08-02 ENCOUNTER — Ambulatory Visit: Payer: 59

## 2016-08-02 DIAGNOSIS — H4720 Unspecified optic atrophy: Secondary | ICD-10-CM | POA: Diagnosis not present

## 2016-08-02 DIAGNOSIS — H53482 Generalized contraction of visual field, left eye: Secondary | ICD-10-CM

## 2016-08-02 DIAGNOSIS — H0520 Unspecified exophthalmos: Secondary | ICD-10-CM

## 2016-08-02 DIAGNOSIS — H3550 Unspecified hereditary retinal dystrophy: Secondary | ICD-10-CM

## 2016-08-02 MED ORDER — IOPAMIDOL (ISOVUE-300) INJECTION 61%
80.0000 mL | Freq: Once | INTRAVENOUS | Status: AC | PRN
  Administered 2016-08-02: 80 mL via INTRAVENOUS

## 2016-08-03 ENCOUNTER — Telehealth: Payer: Self-pay | Admitting: Nurse Practitioner

## 2016-08-03 NOTE — Telephone Encounter (Signed)
Spoke with patient. Advised patient of results as seen below by Dr. Sabra Heck. Patient rescheduled AEX for 02/09/16 with Kem Boroughs, NP. 08 recall in place. Patient is agreeable to date and time.   Notes Recorded by Megan Salon, MD on 08/03/2016 at 5:29 AM EDT Please let pt know her biopsies showed only mild dysplasia (no cancer) and her ECC was negative for abnormal cells. She is scheduled for AEX in Jan. I'd like to do her annual in March if she is ok with this and repeat the pap smear then. 08 recall. Please remove from recall. Thanks.   Routing to provider for final review. Patient is agreeable to disposition. Will close encounter.  CC: Kem Boroughs, NP

## 2016-08-03 NOTE — Telephone Encounter (Signed)
Patient is calling to get her results °

## 2016-08-25 ENCOUNTER — Telehealth: Payer: Self-pay | Admitting: Obstetrics & Gynecology

## 2016-09-09 DIAGNOSIS — D320 Benign neoplasm of cerebral meninges: Secondary | ICD-10-CM

## 2016-09-09 HISTORY — DX: Benign neoplasm of cerebral meninges: D32.0

## 2016-09-09 NOTE — Telephone Encounter (Signed)
error 

## 2016-09-14 HISTORY — PX: EYE SURGERY: SHX253

## 2016-09-20 DIAGNOSIS — D320 Benign neoplasm of cerebral meninges: Secondary | ICD-10-CM | POA: Insufficient documentation

## 2016-10-25 LAB — IPS OTHER TISSUE BIOPSY

## 2016-12-06 ENCOUNTER — Ambulatory Visit: Payer: 59 | Admitting: Nurse Practitioner

## 2017-01-08 IMAGING — CT CT HEAD WO/W CM
3 of 7 series · 12 of 47 positions shown, 14 images · IV contrast (APPLIED)
Comparison: None.

CLINICAL DATA: Exophthalmos left eye. Visual field changes left
eye.

EXAM:
CT HEAD AND ORBITS WITHOUT AND WITH CONTRAST
TECHNIQUE: Contiguous axial images were obtained from the base of the skull
through the vertex with and without contrast. Multidetector CT
imaging of the orbits was performed using the standard protocol with
and without intravenous contrast.
CONTRAST:  80mL 47DTXM-LFF IOPAMIDOL (47DTXM-LFF) INJECTION
61%<Contrast>80mL 47DTXM-LFF IOPAMIDOL (47DTXM-LFF) INJECTION 61%

[Series 10: orbits 2.0 coronal · coronal · 0.16mm/px · 3 of 74 slices shown]
[im 19/74  brain]
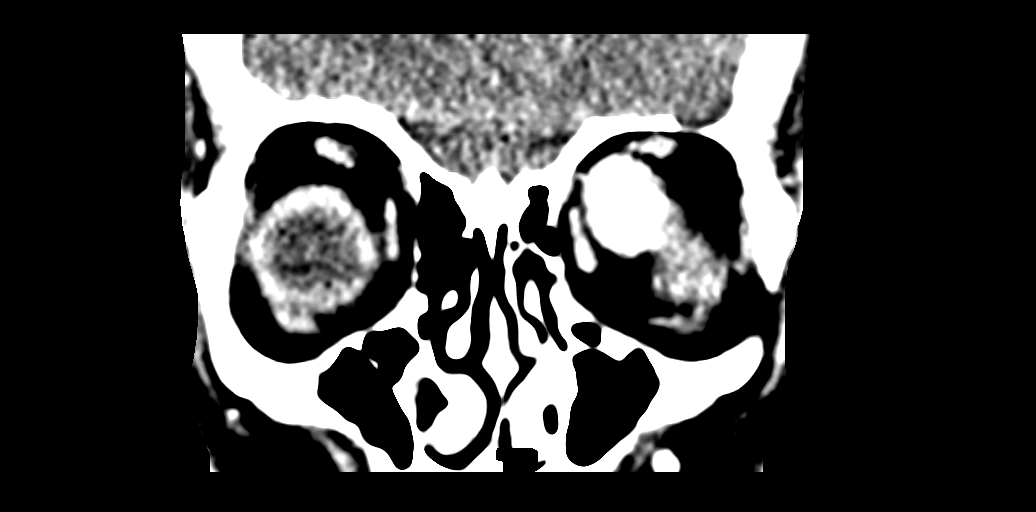
[im 37/74  brain]
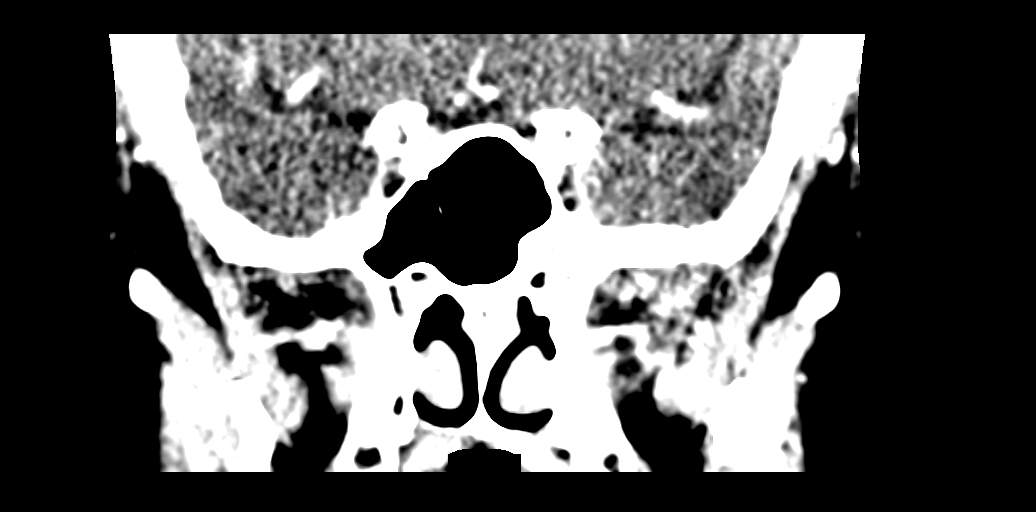
[im 55/74  brain]
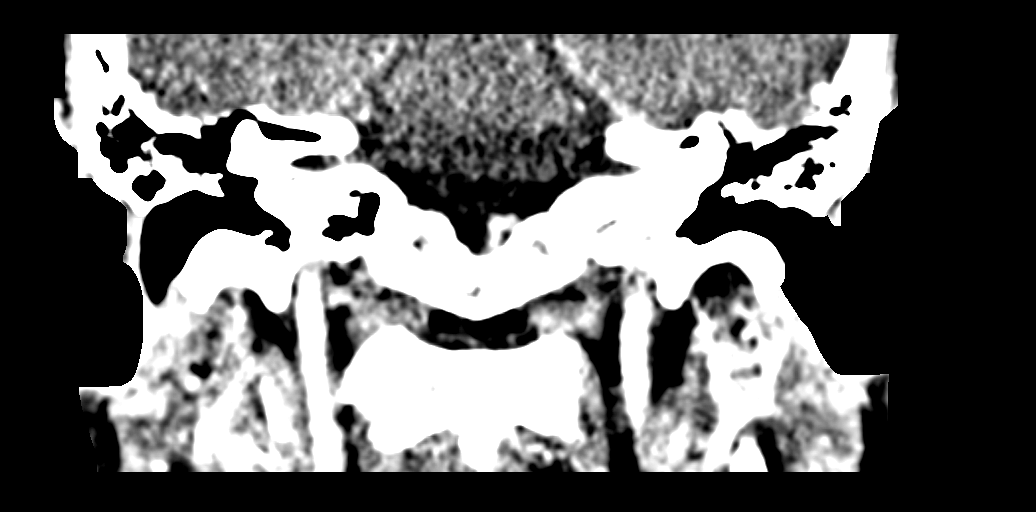

[Series 11: orbits 2.0 sagittal · sagittal · 0.16mm/px · 2 of 78 slices shown]
[im 26/78  brain]
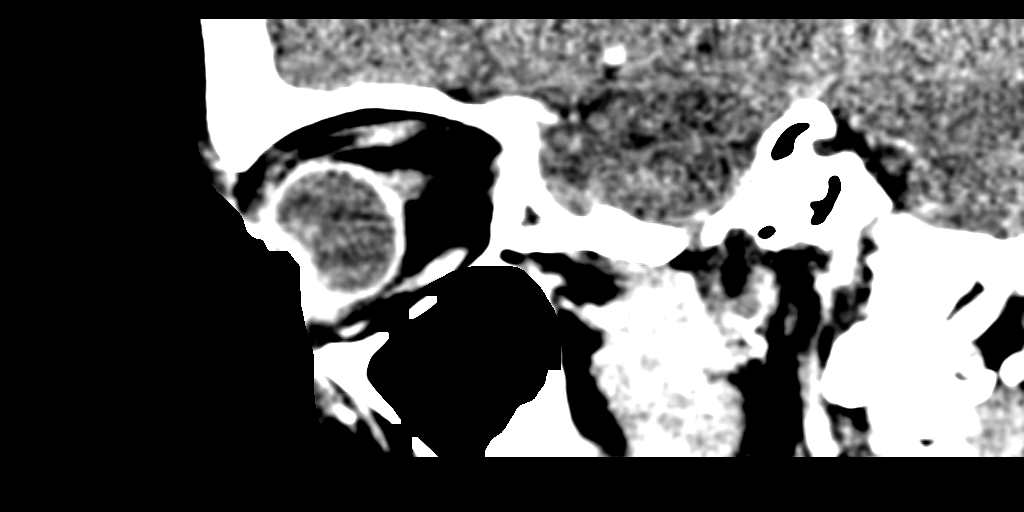
[im 52/78  brain]
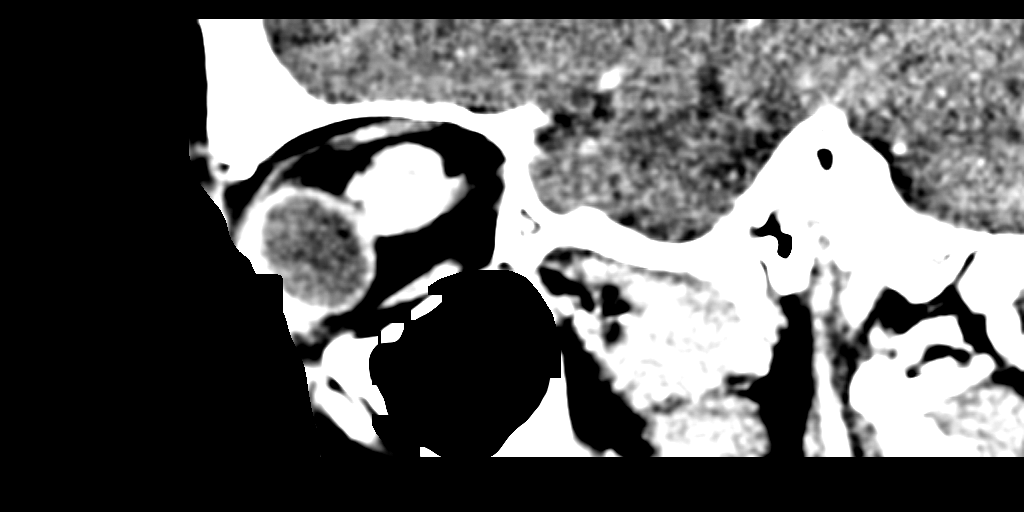

[Series 12: head w soft · axial · 0.39mm/px · z∈[-144,-24]mm · 7 of 32 slices shown, 9 images]
[im 4/32  brain]
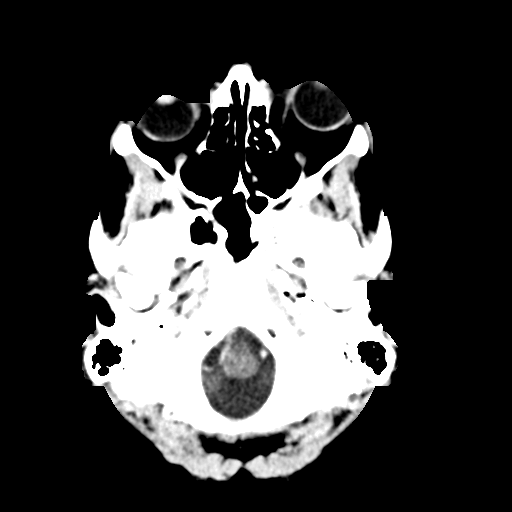
[im 4/32  bone]
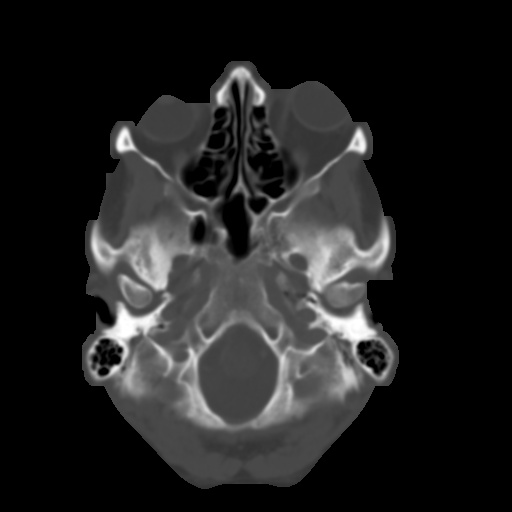
[im 7/32  brain]
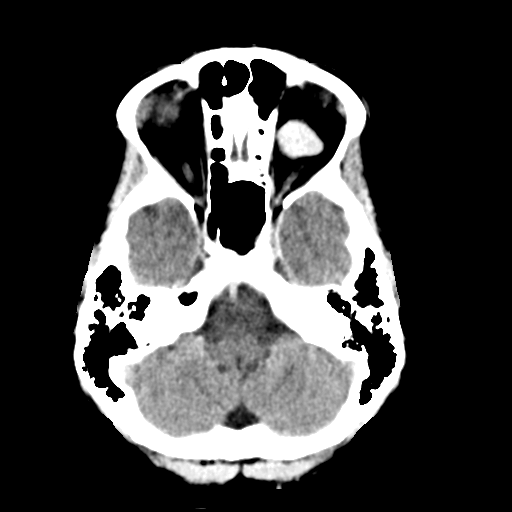
[im 11/32  brain]
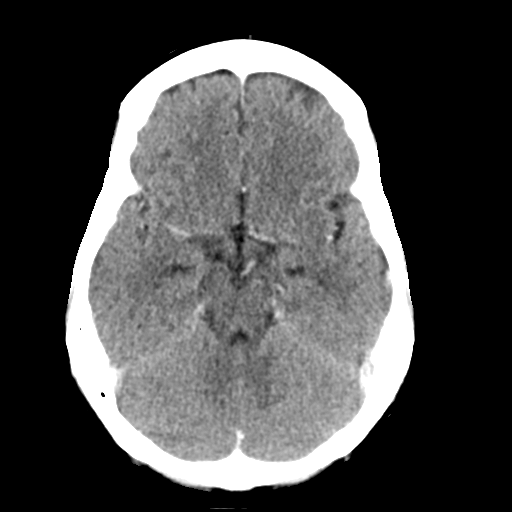
[im 18/32  brain]
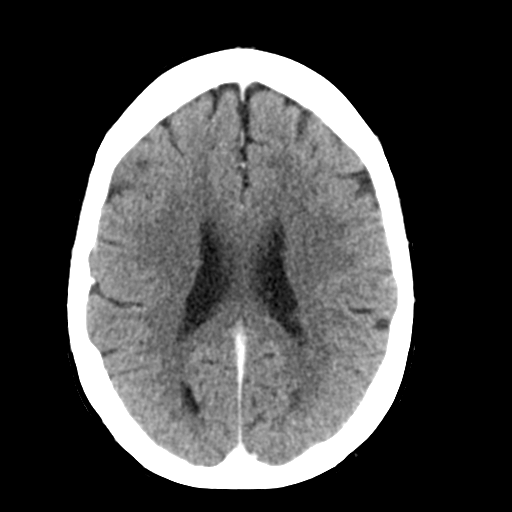
[im 21/32  brain]
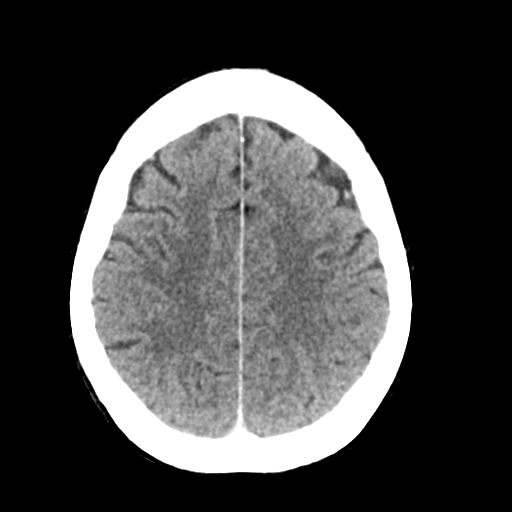
[im 21/32  bone]
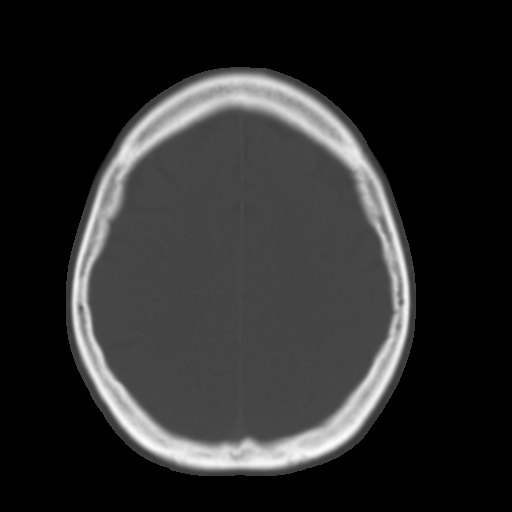
[im 25/32  brain]
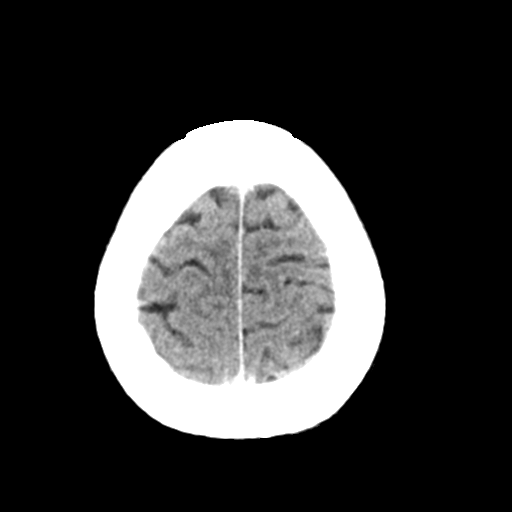
[im 28/32  brain]
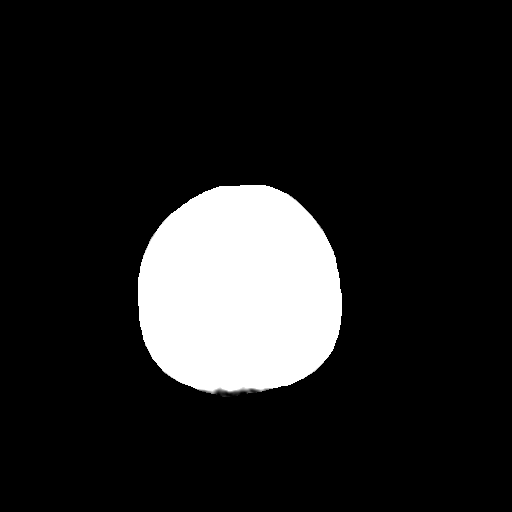

[12 of 47 positions shown; findings below may reference images not displayed]

FINDINGS: CT HEAD FINDINGS

Brain: Ventricle size normal. Cerebral volume normal. Negative for
acute or chronic infarct. No intracranial mass or hemorrhage.
Postcontrast imaging of the brain is normal.

Vascular: Normal vascular enhancement.

Skull: Negative

Sinuses/Orbits: Left orbital mass. See separate CT orbit report
below.

Mild mucosal edema right maxillary sinus.  Remaining sinuses clear.

Other: Remaining soft tissues intact.

CT ORBITS FINDINGS

Well-circumscribed enhancing mass lesion in the left orbit. This is
within the intraconal fat and is located in the superior medial
orbit and appears separate from the extra-ocular muscles. The mass
is displacing the optic nerve but does not appear to be arising from
the optic nerve. The mass measures approximately 18 x 20 x 17 mm and
is without any calcification. Mass is causing displacement of the
globe inferiorly and laterally in causing mild exophthalmos.
Lacrimal gland normal.

Right orbit normal

No bony destruction in the orbit.
IMPRESSION: Negative CT of the brain with contrast

Left orbital mass lesion 18 x 20 x 17 mm causing exophthalmos and
compression of the optic nerve. The mass is within the intraconal
fat and is well-circumscribed and shows homogeneous enhancement.
This appears to be a neoplasm. Imaging features are nonspecific.
Differential includes schwannoma, rhabdomyosarcoma, metastatic
disease, lymphoma. Vascular malformation and meningioma not felt
likely.

These results were called by telephone at the time of interpretation
on 08/02/2016 at [DATE] to Dr. MARTA LENHART , who verbally
acknowledged these results.

## 2017-02-08 ENCOUNTER — Ambulatory Visit (INDEPENDENT_AMBULATORY_CARE_PROVIDER_SITE_OTHER): Payer: 59 | Admitting: Nurse Practitioner

## 2017-02-08 ENCOUNTER — Encounter: Payer: Self-pay | Admitting: Nurse Practitioner

## 2017-02-08 VITALS — BP 116/72 | HR 64 | Ht 67.0 in | Wt 138.0 lb

## 2017-02-08 DIAGNOSIS — N912 Amenorrhea, unspecified: Secondary | ICD-10-CM

## 2017-02-08 DIAGNOSIS — Z124 Encounter for screening for malignant neoplasm of cervix: Secondary | ICD-10-CM

## 2017-02-08 DIAGNOSIS — Z0000 Encounter for general adult medical examination without abnormal findings: Secondary | ICD-10-CM | POA: Diagnosis not present

## 2017-02-08 DIAGNOSIS — R87810 Cervical high risk human papillomavirus (HPV) DNA test positive: Secondary | ICD-10-CM

## 2017-02-08 DIAGNOSIS — Z01411 Encounter for gynecological examination (general) (routine) with abnormal findings: Secondary | ICD-10-CM

## 2017-02-08 NOTE — Progress Notes (Signed)
56 y.o. G77P2002 Married  Caucasian Fe here for annual exam.  Continued amenorrhea without spotting. 11/2015 FSH was 122.1. Still has problems with vaginal dryness and does use OTC lubrication.  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  Treated with radiation X 6 wk's.  Now vision is 20/30 in left eye.  Saw Dr. Hoyle Sauer in [place] who sent her to [facility].   Now a new grandmother, her son had a baby boy son 08/11/16 and daughter having baby boy ~ 03/27/17.    Patient's last menstrual period was 08/14/2014 (approximate).          Sexually active: Yes.    The current method of family planning is post menopausal status.    Exercising: Yes.    Gym/ health club routine includes cardio and weights.  Is getting back into routine after being away due to radiation treatment.. Smoker:  no  Health Maintenance: Pap: Colpo: 07/28/16, LGSIL  Pap: 12/01/15, Negative with pos HR HPV, negative 16/18/45  Pap: 11/20/12, Negative with neg HR HPV MMG:01/18/17, 3D, Bi-Rads 2:  Benign, Care Everywhere Colonoscopy:12/03/12, normal, repeat in 10 years BMD:09/2014, Dr. Radene Gunning, normal TDaP: 10/2017 Hep C and HIV: 12/01/15 Labs: PCP takes care of all labs   reports that she quit smoking about 22 years ago. Her smoking use included Cigarettes. She has a 3.50 pack-year smoking history. She has never used smokeless tobacco. She reports that she drinks alcohol. She reports that she does not use drugs.  Past Medical History:  Diagnosis Date  . 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 Seasonal allergies     Past Surgical History:  Procedure Laterality Date  . TONSILLECTOMY  1977  . wisdom teeth removal  1984    Current Outpatient Prescriptions  Medication Sig Dispense Refill  . Biotin 1 MG CAPS Take by mouth.    . Cholecalciferol (VITAMIN D) 2000 UNITS CAPS Take by mouth daily.    . Multiple Vitamin (MULTIVITAMIN) tablet  Take 1 tablet by mouth daily.    . Omega-3 Fatty Acids (OMEGA 3 PO) Take by mouth.    . SUMAtriptan (IMITREX) 100 MG tablet Take 100 mg by mouth daily as needed.  11   No current facility-administered medications for this visit.     Family History  Problem Relation Age of Onset  . Heart disease Father   . Lung cancer Father 62  . Osteoporosis Mother   . Diverticulitis Brother   . Ovarian cancer Paternal Aunt   . Ovarian cancer Paternal Aunt   . Cancer Maternal Grandfather     esophageal cancer  . Lung cancer Paternal Grandfather   . Colon cancer Neg Hx   . Stomach cancer Neg Hx     ROS:  Pertinent items are noted in HPI.  Otherwise, a comprehensive ROS was negative.  Exam:   BP 116/72 (BP Location: Right Arm, Patient Position: Sitting, Cuff Size: Normal)   Pulse 64   Ht 5\' 7"  (1.702 m)   Wt 138 lb (62.6 kg)   LMP 08/14/2014 (Approximate)   BMI 21.61 kg/m  Height: 5\' 7"  (170.2 cm) Ht Readings from Last 3 Encounters:  02/08/17 5\' 7"  (1.702 m)  07/28/16 5\' 7"  (1.702 m)  12/01/15 5\' 7"  (1.702 m)    General appearance: alert, cooperative and appears stated age Head: Normocephalic, without obvious abnormality, atraumatic Neck: no adenopathy, supple, symmetrical, trachea midline and thyroid normal  to inspection and palpation Lungs: clear to auscultation bilaterally Breasts: normal appearance, no masses or tenderness Heart: regular rate and rhythm Abdomen: soft, non-tender; no masses,  no organomegaly Extremities: extremities normal, atraumatic, no cyanosis or edema Skin: Skin color, texture, turgor normal. No rashes or lesions Lymph nodes: Cervical, supraclavicular, and axillary nodes normal. No abnormal inguinal nodes palpated Neurologic: Grossly normal   Pelvic: External genitalia:  no lesions              Urethra:  normal appearing urethra with no masses, tenderness or lesions              Bartholin's and Skene's: normal                 Vagina: normal appearing  vagina with normal color and discharge, no lesions              Cervix: anteverted              Pap taken: Yes.   Bimanual Exam:  Uterus:  normal size, contour, position, consistency, mobility, non-tender              Adnexa: no mass, fullness, tenderness               Rectovaginal: Confirms               Anus:  normal sphincter tone, no lesions  Chaperone present: yes  A:  Well Woman with normal exam  Husband with vasectomy             Amenorrhea while on OCP - off OCP since 02/2015 with amenorrhea             Tolerable vaso symptoms - does not want HRT  History of abnormal pap with + HR HPV = colpo showed LGSIL 2017  New diagnosis of left Menigothelial Meningoma grade I treated with surgery and Radiation 08/2016   P:   Reviewed health and wellness pertinent to exam  Pap smear was done and will call with results  Mammogram is due 01/2018  Counseled on breast self exam, mammography screening, adequate intake of calcium and vitamin D, diet and exercise, Kegel's exercises return annually or prn  An After Visit Summary was printed and given to the patient.

## 2017-02-08 NOTE — Patient Instructions (Signed)

## 2017-02-11 NOTE — Progress Notes (Signed)
Reviewed personally.  M. Suzanne Teneisha Gignac, MD.  

## 2017-02-15 ENCOUNTER — Other Ambulatory Visit: Payer: Self-pay | Admitting: Certified Nurse Midwife

## 2017-02-15 LAB — IPS HPV ON A LIQUID BASED SPECIMEN

## 2017-02-15 NOTE — Addendum Note (Signed)
Addended by: Regina Eck on: 02/15/2017 10:58 AM   Modules accepted: Orders

## 2017-02-17 LAB — IPS PAP TEST WITH HPV

## 2018-01-16 ENCOUNTER — Encounter: Payer: Self-pay | Admitting: Obstetrics & Gynecology

## 2018-02-13 ENCOUNTER — Ambulatory Visit: Payer: 59 | Admitting: Nurse Practitioner

## 2018-04-02 ENCOUNTER — Other Ambulatory Visit: Payer: Self-pay

## 2018-04-02 ENCOUNTER — Encounter: Payer: Self-pay | Admitting: Obstetrics & Gynecology

## 2018-04-02 ENCOUNTER — Ambulatory Visit (INDEPENDENT_AMBULATORY_CARE_PROVIDER_SITE_OTHER): Payer: 59 | Admitting: Obstetrics & Gynecology

## 2018-04-02 ENCOUNTER — Other Ambulatory Visit (HOSPITAL_COMMUNITY)
Admission: RE | Admit: 2018-04-02 | Discharge: 2018-04-02 | Disposition: A | Discharge status: Home / Self Care | Payer: Managed Care, Other (non HMO) | Source: Ambulatory Visit | Attending: Obstetrics & Gynecology | Admitting: Obstetrics & Gynecology

## 2018-04-02 VITALS — BP 128/66 | HR 64 | Resp 14 | Ht 67.0 in | Wt 138.8 lb

## 2018-04-02 DIAGNOSIS — B977 Papillomavirus as the cause of diseases classified elsewhere: Secondary | ICD-10-CM | POA: Insufficient documentation

## 2018-04-02 DIAGNOSIS — Z01411 Encounter for gynecological examination (general) (routine) with abnormal findings: Secondary | ICD-10-CM | POA: Diagnosis not present

## 2018-04-02 DIAGNOSIS — Z124 Encounter for screening for malignant neoplasm of cervix: Secondary | ICD-10-CM | POA: Diagnosis not present

## 2018-04-02 DIAGNOSIS — M2550 Pain in unspecified joint: Secondary | ICD-10-CM | POA: Diagnosis not present

## 2018-04-02 NOTE — Progress Notes (Signed)
57 y.o. [id number] MarriedCaucasianF here for annual exam.  Diagnosed with optic nerve sheath meningioma.  Was treated with radiation.  Had MRI done early this month.  Tumor was stable.  Now being followed by Dr. Lucy Antigua who is an optic nerve specialist at [facility].  Will have yearly MRIs for follow-up.   Denies vaginal bleeding.    Having increased issues with muscle aches and joint pain especially with exercise.  Is very active.  Does have some chronic insomnia.  Has hx of night sweats and this is improved.    PCP:  Dr. Dagmar Hait.    Patient's last menstrual period was 08/14/2014 (approximate).          Sexually active: Yes.    The current method of family planning is post menopausal status.    Exercising: Yes.    active Smoker:  no  Health Maintenance: Pap:  02/08/17 Neg. HR HPV:neg   12/01/15 Neg. HR HPV:+detected. #16 Neg. #18/45 + History of abnormal Pap:  Yes, 2017 Colpo: CIN I  MMG:  01/09/18 BIRADS2:Benign  Colonoscopy:  11/2012 Normal. F/u 10 years  BMD:   01/01/14  TDaP:  10/2017 Pneumonia vaccine(s):  n/a Shingrix: had one 03/23/18  Hep C testing: 12/01/15 Neg  Screening Labs: PCP   reports that she quit smoking about 23 years ago. Her smoking use included cigarettes. She has a 3.50 pack-year smoking history. She has never used smokeless tobacco. She reports that she drinks alcohol. She reports that she does not use drugs.  Past Medical History:  Diagnosis Date  . 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 Darius Bump, [facility]  . Seasonal allergies     Past Surgical History:  Procedure Laterality Date  . TONSILLECTOMY  1977  . wisdom teeth removal  1984    Current Outpatient Medications  Medication Sig Dispense Refill  . Ascorbic Acid (VITAMIN C) 1000 MG tablet Take by mouth daily.    . Biotin 1 MG CAPS Take by mouth.    . Cholecalciferol (VITAMIN D) 2000 UNITS CAPS Take by mouth daily.    . Omega-3 Fatty Acids (OMEGA 3 PO) Take by  mouth.    . SUMAtriptan (IMITREX) 100 MG tablet Take 100 mg by mouth daily as needed.  11   No current facility-administered medications for this visit.     Family History  Problem Relation Age of Onset  . Heart disease Father   . Lung cancer Father 69  . Osteoporosis Mother   . Diverticulitis Brother   . Ovarian cancer Paternal Aunt   . Ovarian cancer Paternal Aunt   . Cancer Maternal Grandfather        esophageal cancer  . Lung cancer Paternal Grandfather   . Colon cancer Neg Hx   . Stomach cancer Neg Hx     Review of Systems  Constitutional:       Weight gain  Genitourinary: Positive for frequency.       Loss of sexual interest   Musculoskeletal: Positive for myalgias.  Endo/Heme/Allergies:       Craving sweets Heat/cold intolerance   Psychiatric/Behavioral:       Difficulty with memory   All other systems reviewed and are negative.   Exam:   BP 128/66 (BP Location: Right Arm, Patient Position: Sitting, Cuff Size: Normal)   Pulse 64   Resp 14   Ht 5' 7"  (1.702 m)   Wt 138 lb 12.8 oz (63 kg)  LMP 08/14/2014 (Approximate)   BMI 21.74 kg/m    Height: 5' 7"  (170.2 cm)  Ht Readings from Last 3 Encounters:  04/02/18 5' 7"  (1.702 m)  02/08/17 5' 7"  (1.702 m)  07/28/16 5' 7"  (1.702 m)    General appearance: alert, cooperative and appears stated age Head: Normocephalic, without obvious abnormality, atraumatic Neck: no adenopathy, supple, symmetrical, trachea midline and thyroid normal to inspection and palpation Lungs: clear to auscultation bilaterally Breasts: normal appearance, no masses or tenderness Heart: regular rate and rhythm Abdomen: soft, non-tender; bowel sounds normal; no masses,  no organomegaly Extremities: extremities normal, atraumatic, no cyanosis or edema Skin: Skin color, texture, turgor normal. No rashes or lesions Lymph nodes: Cervical, supraclavicular, and axillary nodes normal. No abnormal inguinal nodes palpated Neurologic: Grossly  normal   Pelvic: External genitalia:  no lesions              Urethra:  normal appearing urethra with no masses, tenderness or lesions              Bartholins and Skenes: normal                 Vagina: normal appearing vagina with normal color and discharge, no lesions              Cervix: no lesions              Pap taken: Yes.   Bimanual Exam:  Uterus:  normal size, contour, position, consistency, mobility, non-tender              Adnexa: normal adnexa and no mass, fullness, tenderness               Rectovaginal: Confirms               Anus:  normal sphincter tone, no lesions  Chaperone was present for exam.  A:  Well Woman with normal exam PMP, symptomatic with insomnia, night sweats, decreased libido Joint pain H/o migraines Family hx of twin aunts both with ovarian cancer  P:   Mammogram guidelines reviewed.  Doing yearly pap smear with HR HPV obtained today Will check ESR, CRP, CBC and ANA today return annually or prn

## 2018-04-03 LAB — CBC
Hematocrit: 40.2 % (ref 34.0–46.6)
Hemoglobin: 13.3 g/dL (ref 11.1–15.9)
MCH: 29.1 pg (ref 26.6–33.0)
MCHC: 33.1 g/dL (ref 31.5–35.7)
MCV: 88 fL (ref 79–97)
Platelets: 331 x10E3/uL (ref 150–450)
RBC: 4.57 x10E6/uL (ref 3.77–5.28)
RDW: 13.5 % (ref 12.3–15.4)
WBC: 5.7 x10E3/uL (ref 3.4–10.8)

## 2018-04-03 LAB — SEDIMENTATION RATE: Sed Rate: 2 mm/h (ref 0–40)

## 2018-04-03 LAB — ANA: Anti Nuclear Antibody(ANA): NEGATIVE

## 2018-04-03 LAB — C-REACTIVE PROTEIN: CRP: 1.1 mg/L (ref 0.0–4.9)

## 2018-04-04 LAB — CYTOLOGY - PAP
DIAGNOSIS: NEGATIVE
HPV (WINDOPATH): NOT DETECTED

## 2018-04-12 ENCOUNTER — Ambulatory Visit (INDEPENDENT_AMBULATORY_CARE_PROVIDER_SITE_OTHER): Payer: 59 | Admitting: Obstetrics & Gynecology

## 2018-04-12 ENCOUNTER — Encounter: Payer: Self-pay | Admitting: Obstetrics & Gynecology

## 2018-04-12 ENCOUNTER — Other Ambulatory Visit: Payer: Self-pay

## 2018-04-12 VITALS — BP 116/60 | HR 60 | Resp 16 | Ht 67.0 in | Wt 140.0 lb

## 2018-04-12 DIAGNOSIS — R5383 Other fatigue: Secondary | ICD-10-CM

## 2018-04-12 DIAGNOSIS — D320 Benign neoplasm of cerebral meninges: Secondary | ICD-10-CM | POA: Diagnosis not present

## 2018-04-12 DIAGNOSIS — M2550 Pain in unspecified joint: Secondary | ICD-10-CM

## 2018-04-12 NOTE — Progress Notes (Signed)
GYNECOLOGY  VISIT  CC:   Discuss lab work with pt  HPI: 57 y.o. G42P2002 Married Caucasian female here for discussion of recent lab work and what are next steps with evaluation/care.  For about a year, she's been experiencing significant fatigue and joint pain.    She feels this happened after the finishing radiation for her primary meningioma of the optic nerve.  She was fatigued during the radiation but this was expected.  She has been a long term regular exerciser but because of the daily radiation for six weeks, she didn't work out much.  After the radiation, she started exercising in the gym but after every time she went to the gym, she had significant joint and muscle pain/fatigue.  She expected this at first but it never improved.  As she's been a regular exerciser, she knew this wasn't normal.  She has noticed as well that she occasionally drops an item because she doesn't feel strong enough to hold something in her hands.  There is no pain when this happens.  This isn't necessarily with a heavy item.  Also having a lot more issues getting a lid off a jar.  Has to get her husband to help with this.    She does have a family hx of RA in her great-grandmother.  Grandmother  was wheelchair bound.  Grandmother had a lot of issues with arthritis but pt is unsure if grandmother had RA.  Also, her mother has significant issues with arthritis.  She has not been diagnosed with rheumatoid arthritis.  Pt report she now has joint pain, particularly in her hands but no changes in the appearance of her joints.  She has not been diagnosed with arthritis but she assumes that what it is.    Other than a basketball injury to a finger, she's never had any significant injury.    Pt had ANA, CRP, CBC, and Sed rate obtained.  These were all normal.  GYNECOLOGIC HISTORY: Patient's last menstrual period was 08/14/2014 (approximate). Contraception: PMP Menopausal hormone therapy: none  Patient Active Problem List    Diagnosis Date Noted  . 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 Darius [facility]ump, [facility]  . Seasonal allergies     Past Surgical History:  Procedure Laterality Date  . TONSILLECTOMY  1977  . wisdom teeth removal  1984    MEDS:   Current Outpatient Medications on File Prior to Visit  Medication Sig Dispense Refill  . Ascorbic Acid (VITAMIN C) 1000 MG tablet Take by mouth daily.    . Biotin 1 MG CAPS Take by mouth.    . Cholecalciferol (VITAMIN D) 2000 UNITS CAPS Take by mouth daily.    . Omega-3 Fatty Acids (OMEGA 3 PO) Take by mouth.    . SUMAtriptan (IMITREX) 100 MG tablet Take 100 mg by mouth daily as needed.  11   No current facility-administered medications on file prior to visit.     ALLERGIES: Patient has no known allergies.  Family History  Problem Relation Age of Onset  . Heart disease Father   . Lung cancer Father 38  . Osteoporosis Mother   . Diverticulitis Brother   . Ovarian cancer Paternal Aunt   . Ovarian cancer Paternal Aunt   . Cancer Maternal Grandfather        esophageal cancer  .  Lung cancer Paternal Grandfather     SH:  Married, non smoker  Review of Systems  Constitutional: Positive for malaise/fatigue.  Respiratory: Negative.   Cardiovascular: Negative.   Neurological: Positive for weakness.  Psychiatric/Behavioral: Negative.     PHYSICAL EXAMINATION:    BP 116/60 (BP Location: Right Arm, Patient Position: Sitting, Cuff Size: Normal)   Pulse 60   Resp 16   Ht 5\' 7"  (1.702 m)   Wt 140 lb (63.5 kg)   LMP 08/14/2014 (Approximate)   BMI 21.93 kg/m     General appearance: alert, cooperative and appears stated age No additional physical exam today  Assessment: Fatigue and joint pain Family hx of RA  Plan: I feel her symptoms are not typical and that there may be an underlying  rheumatologic disorder.  Also feel it would be useful to see if she truly has arthritis vs another issue causing her joint symptoms.  Will start with referral to ortho for additional evaluation.  Pt very comfortable with plan.   ~20 minutes spent with patient >50% of time was in face to face discussion of above.

## 2018-04-26 ENCOUNTER — Ambulatory Visit: Payer: 59 | Admitting: Obstetrics & Gynecology

## 2018-11-14 DIAGNOSIS — H3589 Other specified retinal disorders: Secondary | ICD-10-CM

## 2018-11-14 HISTORY — DX: Other specified retinal disorders: H35.89

## 2019-07-03 ENCOUNTER — Other Ambulatory Visit: Payer: Self-pay

## 2019-07-05 ENCOUNTER — Ambulatory Visit (INDEPENDENT_AMBULATORY_CARE_PROVIDER_SITE_OTHER): Payer: 59 | Admitting: Obstetrics & Gynecology

## 2019-07-05 ENCOUNTER — Encounter: Payer: Self-pay | Admitting: Obstetrics & Gynecology

## 2019-07-05 ENCOUNTER — Other Ambulatory Visit: Payer: Self-pay

## 2019-07-05 VITALS — BP 110/70 | HR 64 | Temp 97.2°F | Ht 67.0 in | Wt 141.8 lb

## 2019-07-05 DIAGNOSIS — Z01411 Encounter for gynecological examination (general) (routine) with abnormal findings: Secondary | ICD-10-CM

## 2019-07-05 NOTE — Progress Notes (Signed)
58 y.o. G65P2002 Married White or Caucasian female here for annual exam.  Hx of optic nerve meningioma.  Treated with radiation.  Having retinal swelling that is likely from the radiation.  She is being treated with intraocular injection.  Has done two of these.  Will have next injection in 4 weeks.  Will have repeat eye in 8 weeks.  Being followed by Dr. Dennison Nancy at [facility].  Visual field on the left eye is decreased but she can still drive.    Denies vaginal bleeding.    PCP:  Dr. Dagmar Hait.  Did blood work within the last year.  Patient's last menstrual period was 08/14/2014 (approximate).          Sexually active: Yes.    The current method of family planning is post menopausal status.    Exercising: Yes.    walk Smoker:  no  Health Maintenance: Pap:  04/02/18 Neg. HR HPV:neg   02/08/17 Neg. HR HPV:neg   11/30/16 neg.  +HR HPV.  +18/45 testing. History of abnormal Pap:  Yes, CIN I MMG:  01/17/19 BIRADS1:neg Colonoscopy:  12/03/12 normal.   BMD:   2015.  She will have this done at Dr. Danna Hefty office sometime in the next year.  This was scheduled but was canceled due to Covid. TDaP:  2017 Pneumonia vaccine(s):  n/a Shingrix:   Completed  Hep C testing: 12/01/15 neg Screening Labs: PCP   reports that she quit smoking about 24 years ago. Her smoking use included cigarettes. She has a 3.50 pack-year smoking history. She has never used smokeless tobacco. She reports current alcohol use of about 2.0 standard drinks of alcohol per week. She reports that she does not use drugs.  Past Medical History:  Diagnosis Date  . 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 Darius Bump, [facility]  . Radiation retinopathy 2020  . Seasonal allergies     Past Surgical History:  Procedure Laterality Date  . TONSILLECTOMY  1977  . wisdom teeth removal  1984    Current Outpatient Medications  Medication Sig Dispense Refill  . Cholecalciferol (VITAMIN D) 2000 UNITS CAPS Take  by mouth daily.    . SUMAtriptan (IMITREX) 100 MG tablet Take 100 mg by mouth daily as needed.  11  . Turmeric 400 MG CAPS Take by mouth.     No current facility-administered medications for this visit.     Family History  Problem Relation Age of Onset  . Heart disease Father   . Lung cancer Father 54  . Osteoporosis Mother   . Diverticulitis Brother   . Ovarian cancer Paternal Aunt   . Ovarian cancer Paternal Aunt   . Cancer Maternal Grandfather        esophageal cancer  . Lung cancer Paternal Grandfather     Review of Systems  All other systems reviewed and are negative.   Exam:   BP 110/70   Pulse 64   Temp (!) 97.2 F (36.2 C) (Temporal)   Ht 5\' 7"  (1.702 m)   Wt 141 lb 12.8 oz (64.3 kg)   LMP 08/14/2014 (Approximate)   BMI 22.21 kg/m   Height: 5\' 7"  (170.2 cm)  Ht Readings from Last 3 Encounters:  07/05/19 5\' 7"  (1.702 m)  04/12/18 5\' 7"  (1.702 m)  04/02/18 5\' 7"  (1.702 m)   General appearance: alert, cooperative and appears stated age Head: Normocephalic, without obvious abnormality, atraumatic Neck: no adenopathy, supple, symmetrical, trachea midline and  thyroid normal to inspection and palpation Lungs: clear to auscultation bilaterally Breasts: normal appearance, no masses or tenderness Heart: regular rate and rhythm Abdomen: soft, non-tender; bowel sounds normal; no masses,  no organomegaly Extremities: extremities normal, atraumatic, no cyanosis or edema Skin: Skin color, texture, turgor normal. No rashes or lesions Lymph nodes: Cervical, supraclavicular, and axillary nodes normal. No abnormal inguinal nodes palpated Neurologic: Grossly normal   Pelvic: External genitalia:  no lesions              Urethra:  normal appearing urethra with no masses, tenderness or lesions              Bartholins and Skenes: normal                 Vagina: normal appearing vagina with normal color and discharge, no lesions              Cervix: no lesions              Pap  taken: No. Bimanual Exam:  Uterus:  normal size, contour, position, consistency, mobility, non-tender              Adnexa: normal adnexa and no mass, fullness, tenderness               Rectovaginal: Confirms               Anus:  normal sphincter tone, no lesions  Chaperone was present for exam.  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  This is UTD pap smear neg with neg HR HPV 2018 and 2019.  Pap smear not indicated today. Lab work is done with Dr. Dagmar Hait She is going to plan a BMD later this year Vaccines are UTD Colonoscopy is UTD Return annually or prn

## 2020-01-25 ENCOUNTER — Ambulatory Visit: Payer: Commercial Managed Care - PPO | Attending: Internal Medicine

## 2020-01-25 DIAGNOSIS — Z23 Encounter for immunization: Secondary | ICD-10-CM

## 2020-01-25 NOTE — Progress Notes (Signed)
   Covid-19 Vaccination Clinic  Name:  Suzanne Carter    MRN: [id number] DOB: Jul 27, 1961  01/25/2020  Suzanne Carter was observed post Covid-19 immunization for 15 minutes without incident. She was provided with Vaccine Information Sheet and instruction to access the V-Safe system.   Suzanne Carter was instructed to call 911 with any severe reactions post vaccine: Marland Kitchen Difficulty breathing  . Swelling of face and throat  . A fast heartbeat  . A bad rash all over body  . Dizziness and weakness   Immunizations Administered    Name Date Dose VIS Date Route   Pfizer COVID-19 Vaccine 01/25/2020  2:04 PM 0.3 mL 10/25/2019 Intramuscular   Manufacturer: [place]   Lot: [id number]   [place]: [id number]

## 2020-02-03 ENCOUNTER — Encounter: Payer: Self-pay | Admitting: Certified Nurse Midwife

## 2020-02-19 ENCOUNTER — Ambulatory Visit: Payer: Commercial Managed Care - PPO | Attending: Internal Medicine

## 2020-02-19 DIAGNOSIS — Z23 Encounter for immunization: Secondary | ICD-10-CM

## 2020-02-19 NOTE — Progress Notes (Signed)
   Covid-19 Vaccination Clinic  Name:  Suzanne Carter    MRN: [id number] DOB: 07/24/1961  02/19/2020  [name] was observed post Covid-19 immunization for 15 minutes without incident. She was provided with Vaccine Information Sheet and instruction to access the V-Safe system.   [name] was instructed to call 911 with any severe reactions post vaccine: Marland Kitchen Difficulty breathing  . Swelling of face and throat  . A fast heartbeat  . A bad rash all over body  . Dizziness and weakness   Immunizations Administered    Name Date Dose VIS Date Route   Pfizer COVID-19 Vaccine 02/19/2020  4:07 PM 0.3 mL 10/25/2019 Intramuscular   Manufacturer: [place]   Lot: [id number]   [place]: [id number]

## 2020-04-17 DIAGNOSIS — H35042 Retinal micro-aneurysms, unspecified, left eye: Secondary | ICD-10-CM | POA: Insufficient documentation

## 2020-09-10 NOTE — Progress Notes (Signed)
59 y.o. G50P2002 Married White or Caucasian female here for annual exam.  H/o optic nerve meningioma.  Having fluid in her eye as a result of treatment.  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  She had to have treatments for cholesterol in her eye that resulted from the fluid and aneurysm.  This has been managed and she is just being followed now.  Vision is good.    Denies vaginal bleeding.  No new gyn concerns.    PCP:  Dr. Dagmar Hait.  Last appt was the early part of the year.  This was virtual.  Has appt scheduled for 12/2020.  Will have blood work done the week prior.    Patient's last menstrual period was 08/14/2014 (approximate).          Sexually active: Yes.    The current method of family planning is vasectomy.    Exercising: Yes.    exercise bike once a week Smoker:  no  Health Maintenance: Pap:  04-02-18 neg HPV HR neg History of abnormal Pap:  yes MMG:  01-20-2020 birads 2:neg (care everywhere) Colonoscopy:  12/03/12 normal BMD:  2015 in chart.  Pt is sure she had one in 2019   TDaP: 2017 Pneumonia vaccine(s):  Not done Shingrix:   2019 Hep C testing: neg 2017 Screening Labs: done with Dr. Dagmar Hait   reports that she quit smoking about 25 years ago. Her smoking use included cigarettes. She has a 3.50 pack-year smoking history. She has never used smokeless tobacco. She reports previous alcohol use. She reports that she does not use drugs.  Past Medical History:  Diagnosis Date  . 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 Darius Bump, [facility]  . Radiation retinopathy 2020  . Seasonal allergies     Past Surgical History:  Procedure Laterality Date  . TONSILLECTOMY  1977  . wisdom teeth removal  1984    Current Outpatient Medications  Medication Sig Dispense Refill  . ascorbic acid (VITAMIN C) 1000 MG tablet Take by mouth as needed.    . SUMAtriptan (IMITREX) 100 MG tablet Take 100 mg by mouth daily as needed.  11  . Turmeric 400 MG  CAPS Take by mouth.    Marland Kitchen VITAMIN D PO Take 5,000 Int'l Units by mouth daily.     No current facility-administered medications for this visit.    Family History  Problem Relation Age of Onset  . Heart disease Father   . Lung cancer Father 21  . Osteoporosis Mother   . Diverticulitis Brother   . Ovarian cancer Paternal Aunt   . Ovarian cancer Paternal Aunt   . Cancer Maternal Grandfather        esophageal cancer  . Lung cancer Paternal Grandfather     Review of Systems  Constitutional: Negative.   HENT: Negative.   Eyes: Negative.   Respiratory: Negative.   Cardiovascular: Negative.   Gastrointestinal: Negative.   Endocrine: Negative.   Genitourinary: Negative.   Musculoskeletal: Negative.   Skin: Negative.   Allergic/Immunologic: Negative.   Neurological: Negative.   Hematological: Negative.   Psychiatric/Behavioral: Negative.     Exam:   BP 110/70   Pulse 68   Resp 16   Ht 5' 7.25" (1.708 m)   Wt 145 lb (65.8 kg)   LMP 08/14/2014 (Approximate)   BMI 22.54 kg/m   Height: 5' 7.25" (170.8 cm)  General appearance: alert, cooperative and appears stated age Head: Normocephalic, without obvious  abnormality, atraumatic Neck: no adenopathy, supple, symmetrical, trachea midline and thyroid normal to inspection and palpation Lungs: clear to auscultation bilaterally Breasts: normal appearance, no masses or tenderness Heart: regular rate and rhythm Abdomen: soft, non-tender; bowel sounds normal; no masses,  no organomegaly Extremities: extremities normal, atraumatic, no cyanosis or edema Skin: Skin color, texture, turgor normal. No rashes or lesions Lymph nodes: Cervical, supraclavicular, and axillary nodes normal. No abnormal inguinal nodes palpated Neurologic: Grossly normal   Pelvic: External genitalia:  no lesions              Urethra:  normal appearing urethra with no masses, tenderness or lesions              Bartholins and Skenes: normal                  Vagina: normal appearing vagina with normal color and discharge, no lesions              Cervix: no lesions              Pap taken: Yes.   Bimanual Exam:  Uterus:  normal size, contour, position, consistency, mobility, non-tender              Adnexa: normal adnexa and no mass, fullness, tenderness               Rectovaginal: Confirms               Anus:  normal sphincter tone, no lesions  Chaperone, Peggye Ley, CMA, was present for exam.  A:  Well Woman with normal exam PMP, no HRT H/o migraines H/o +HR HPV 16/18/45 with CIN 1 in 2017 Family hx of twin aunts both with ovarian cancer  P:   Mammogram guidelines reviewed.  Doing yearly. pap smear guidelines reviewed.  CIN 1 in 2017.  Neg pap and neg HR HPV in 2018 and 2019.  Pap/HR HPV obtained today. Colonoscopy 11/2012.  Follow up 10 years BMD is done with Dr. Dagmar Hait as is blood work. Genetic testing discussed.  Referral placed today.  Reviewed vaccinations with pt Return annually or prn

## 2020-09-11 ENCOUNTER — Ambulatory Visit (INDEPENDENT_AMBULATORY_CARE_PROVIDER_SITE_OTHER): Payer: 59 | Admitting: Obstetrics & Gynecology

## 2020-09-11 ENCOUNTER — Other Ambulatory Visit: Payer: Self-pay

## 2020-09-11 ENCOUNTER — Encounter: Payer: Self-pay | Admitting: Obstetrics & Gynecology

## 2020-09-11 ENCOUNTER — Other Ambulatory Visit (HOSPITAL_COMMUNITY)
Admission: RE | Admit: 2020-09-11 | Discharge: 2020-09-11 | Disposition: A | Discharge status: Home / Self Care | Payer: 59 | Source: Ambulatory Visit | Attending: Obstetrics & Gynecology | Admitting: Obstetrics & Gynecology

## 2020-09-11 VITALS — BP 110/70 | HR 68 | Resp 16 | Ht 67.25 in | Wt 145.0 lb

## 2020-09-11 DIAGNOSIS — Z124 Encounter for screening for malignant neoplasm of cervix: Secondary | ICD-10-CM

## 2020-09-11 DIAGNOSIS — Z01419 Encounter for gynecological examination (general) (routine) without abnormal findings: Secondary | ICD-10-CM | POA: Diagnosis not present

## 2020-09-11 DIAGNOSIS — Z8041 Family history of malignant neoplasm of ovary: Secondary | ICD-10-CM

## 2020-09-14 ENCOUNTER — Telehealth: Payer: Self-pay | Admitting: Genetic Counselor

## 2020-09-14 LAB — CYTOLOGY - PAP
Comment: NEGATIVE
Diagnosis: NEGATIVE
High risk HPV: NEGATIVE

## 2020-09-14 NOTE — Telephone Encounter (Signed)
Received a genetic counseling referral from Dr. Sabra Heck for fhx of ovarian cancer. Suzanne Carter returned my call and has been scheduled for a mychart video visit on 11/22 at 2pm.

## 2020-10-02 ENCOUNTER — Encounter: Payer: Self-pay | Admitting: Genetic Counselor

## 2020-10-02 DIAGNOSIS — Z8041 Family history of malignant neoplasm of ovary: Secondary | ICD-10-CM | POA: Insufficient documentation

## 2020-10-05 ENCOUNTER — Other Ambulatory Visit: Payer: Self-pay

## 2020-10-05 ENCOUNTER — Inpatient Hospital Stay: Payer: Commercial Managed Care - PPO | Attending: Genetic Counselor | Admitting: Genetic Counselor

## 2020-10-05 DIAGNOSIS — Z85828 Personal history of other malignant neoplasm of skin: Secondary | ICD-10-CM | POA: Diagnosis not present

## 2020-10-05 DIAGNOSIS — D320 Benign neoplasm of cerebral meninges: Secondary | ICD-10-CM | POA: Diagnosis not present

## 2020-10-05 DIAGNOSIS — Z8041 Family history of malignant neoplasm of ovary: Secondary | ICD-10-CM

## 2020-10-05 DIAGNOSIS — Z1379 Encounter for other screening for genetic and chromosomal anomalies: Secondary | ICD-10-CM

## 2020-10-05 DIAGNOSIS — Z801 Family history of malignant neoplasm of trachea, bronchus and lung: Secondary | ICD-10-CM

## 2020-10-06 ENCOUNTER — Encounter: Payer: Self-pay | Admitting: Genetic Counselor

## 2020-10-06 DIAGNOSIS — Z801 Family history of malignant neoplasm of trachea, bronchus and lung: Secondary | ICD-10-CM

## 2020-10-06 DIAGNOSIS — Z85828 Personal history of other malignant neoplasm of skin: Secondary | ICD-10-CM

## 2020-10-06 HISTORY — DX: Personal history of other malignant neoplasm of skin: Z85.828

## 2020-10-06 HISTORY — DX: Family history of malignant neoplasm of trachea, bronchus and lung: Z80.1

## 2020-10-06 NOTE — Progress Notes (Signed)
EFERRING PROVIDER: Megan Salon, MD [address] [place],  [place] [address]  PRIMARY PROVIDER:  Prince Solian, MD  PRIMARY REASON FOR VISIT:  1. Family history of ovarian cancer   2. Primary meningioma of optic nerve sheath ([place])   3. History of basal cell carcinoma   4. Family history of lung cancer    I connected with Suzanne Carter on 10/05/2020 at 2pm EDT by [place] video conference and verified that I am speaking with the correct person using two identifiers.   Patient location: Home Provider location: [place]:   Suzanne Carter, a 58 y.o. female, was seen for a [place] cancer genetics consultation at the request of Dr. Sabra Heck due to a family history of ovarian cancer.  Suzanne Carter presents to clinic today to discuss the possibility of a hereditary predisposition to cancer, to discuss genetic testing, and to further clarify her future cancer risks, as well as potential cancer risks for family members.   At the age of 60, Suzanne Carter was diagnosed with basal cell carcinoma.  In 2017, at the age of 52, Suzanne Carter was diagnosed with optic nerve sheath meningioma, which was treated with radiation.   RISK FACTORS:  Menarche was at age 72.  First live birth at age 51.  OCP use for approximately 5 years.  Ovaries intact: yes.  Hysterectomy: no.  Menopausal status: postmenopausal.  HRT use: 0 years. Colonoscopy: most recent in 2014; to return in 10 years. Mammogram within the last year: yes. Number of breast biopsies: 0. Up to date with pelvic exams: yes.  Past Medical History:  Diagnosis Date  . Family history of lung cancer 10/06/2020  . History of basal cell carcinoma 10/06/2020  . 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 Darius Bump, [facility]  . Radiation retinopathy 2020  . Seasonal allergies     Past Surgical History:  Procedure Laterality Date  . TONSILLECTOMY  1977  . wisdom teeth removal  1984     Social History   Socioeconomic History  . Marital status: Married    Spouse name: Not on file  . Number of children: 2  . Years of education: Not on file  . Highest education level: Not on file  Occupational History    Employer: [employer]  Tobacco Use  . Smoking status: Former Smoker    Packs/day: 0.25    Years: 14.00    Pack years: 3.50    Types: Cigarettes    Quit date: 11/12/1994    Years since quitting: 25.9  . Smokeless tobacco: Never Used  Vaping Use  . Vaping Use: Never used  Substance and Sexual Activity  . Alcohol use: Not Currently  . Drug use: No  . Sexual activity: Yes    Partners: Male    Birth control/protection: Surgical    Comment: vasectomy  Other Topics Concern  . Not on file  Social History Narrative  . Not on file   Social Determinants of Health   Financial Resource Strain:   . Difficulty of Paying Living Expenses: Not on file  Food Insecurity:   . Worried About Charity fundraiser in the Last Year: Not on file  . Ran Out of Food in the Last Year: Not on file  Transportation Needs:   . Lack of Transportation (Medical): Not on file  . Lack of Transportation (Non-Medical): Not on file  Physical Activity:   .  Days of Exercise per Week: Not on file  . Minutes of Exercise per Session: Not on file  Stress:   . Feeling of Stress : Not on file  Social Connections:   . Frequency of Communication with Friends and Family: Not on file  . Frequency of Social Gatherings with Friends and Family: Not on file  . Attends Religious Services: Not on file  . Active Member of Clubs or Organizations: Not on file  . Attends Archivist Meetings: Not on file  . Marital Status: Not on file     FAMILY HISTORY:  We obtained a detailed, 4-generation family history.  Significant diagnoses are listed below: Family History  Problem Relation Age of Onset  . Lung cancer Father 24  . Ovarian cancer Paternal Aunt        dx 32s  . Ovarian  cancer Paternal Aunt        dx 71s  . Esophageal cancer Maternal Grandfather        dx late 60s-early 70s  . Lung cancer Paternal Grandfather        d. > 83  . Cancer Cousin        retinal     Suzanne Carter has one son and one daughter, both without a history of cancer.  Suzanne Carter has three brothers in their 27s without a history of cancer.  Suzanne Carter mother is 29 and has not had cancer.  Suzanne Carter maternal grandfather had esophagus cancer in his late 88s or early 97s.  No other maternal family history of cancer was reported.  Suzanne Carter father is deceased and had lung cancer diagnosed at age 64.  Suzanne Carter's paternal aunts (identical twins) both had a history of ovarian cancer and both passed away at age 66.  Suzanne Carter reports that one aunt was initially diagnosed in her 44s and then again in her 6s.  The other aunt was diagnosed in her 61s.  Suzanne Carter paternal female cousin, now in her 53s, was diagnosed with retinal cancer in her 19s.  Suzanne Carter's paternal grandfather was diagnosed with lung cancer and passed away after the age of 32.  No other paternal family history of cancer was reported.   Suzanne Carter is unaware of previous family history of genetic testing for hereditary cancer risks. Patient's maternal ancestors are of [place] and Native American descent, and paternal ancestors are of [place], [place], [place], and [place] descent. There is no reported Ashkenazi Jewish ancestry. There is no known consanguinity.  GENETIC COUNSELING ASSESSMENT: Suzanne Carter is a 59 y.o. female with a family history of cancer which is somewhat suggestive of a hereditary ovarian cancer syndrome and predisposition to cancer given multiple diagnoses of ovarian cancer on the same side of the family. We, therefore, discussed and recommended the following at today's visit.   DISCUSSION: We discussed that 5 - 10% of cancer is hereditary, with most cases of hereditary ovarian cancer associated with mutations in BRCA1 or  BRCA2.  There are other genes that can be associated with hereditary ovarian cancer syndromes.  Type of cancer risk and level of risk are gene-specific.  We discussed that testing is beneficial for several reasons, including knowing about other cancer risks, identifying potential screening and risk-reduction options that may be appropriate, and to understanding if other family members could be at risk for cancer and allowing them to undergo genetic testing.  We reviewed the characteristics, features and inheritance patterns of hereditary cancer syndromes. We also  discussed genetic testing, including the appropriate family members to test, the process of testing, insurance coverage and turn-around-time for results. We discussed the implications of a negative, positive, carrier and/or variant of uncertain significant result. We discussed that negative results would be uninformative given that Suzanne Carter does not have a personal history of cancer. We recommended Suzanne Carter pursue genetic testing for the Common Hereditary Cancers Panel through [facility].   The Common Hereditary Cancers Panel offered by [facility] includes sequencing and/or deletion duplication testing of the following 49 genes: APC, ATM, AXIN2, BARD1, BMPR1A, BRCA1, BRCA2, BRIP1, CDH1, CDK4, CDKN2A (p14ARF), CDKN2A (p16INK4a), CHEK2, CTNNA1, DICER1, EPCAM (Deletion/duplication testing only), GREM1 (promoter region deletion/duplication testing only), KIT, MEN1, MLH1, MSH2, MSH3, MSH6, MUTYH, NBN, NF1, NF2, NHTL1, PALB2, PDGFRA, PMS2, POLD1, POLE, PTEN, RAD50, RAD51C, RAD51D, RNF43, SDHB, SDHC, SDHD, SMAD4, SMARCA4. STK11, TP53, TSC1, TSC2, and VHL.  The following genes were evaluated for sequence changes only: SDHA and HOXB13 c.251G>A variant only.  Based on Ms. Wentling's family history of ovarian cancer in two paternal aunts, she meets medical criteria for genetic testing. Despite that she meets criteria, she may still have an out of pocket cost.   We  discussed that some people do not want to undergo genetic testing due to fear of genetic discrimination.  A federal law called the Genetic Information Non-Discrimination Act (GINA) of 2008 helps protect individuals against genetic discrimination based on their genetic test results.  It impacts both health insurance and employment.  With health insurance, it protects against increased premiums, being kicked off insurance, or being forced to take a test in order to be insured.  For employment it protects against hiring, firing and promoting decisions based on genetic test results.  GINA does not apply to those in the [employer], those who work for companies with less than 15 employees, and new life insurance or long-term disability in [employer].  Health status due to a cancer diagnosis is not protected under GINA.  PLAN:  Ms. Grandmaison did not wish to pursue genetic testing at today's visit. We understand this decision and remain available to coordinate genetic testing at any time in the future. We will contact Ms. Deblasi within the next week to follow up on some additional questions she had regarding implications of genetic testing. We recommend Ms. Rickles continue to follow the cancer screening guidelines given by her primary healthcare provider.  Based on Ms. Antolin's family history of ovarian cancer, we recommended her brothers and paternal cousins have genetic counseling and testing. Ms. Feggins will let us know if we can be of any assistance in coordinating genetic counseling and/or testing for these family members.   Lastly, we encouraged Ms. Mcmains to remain in contact with cancer genetics annually so that we can continuously update the family history and inform her of any changes in cancer genetics and testing that may be of benefit for this family.   Ms. Gayman questions were answered to her satisfaction today. Our contact information was provided should additional questions or concerns arise. Thank you  for the referral and allowing [place] to share in the care of your patient.   Cari M. Joette Catching, [place], [facility] Certified Film/video [email or web address] (P) [phone]   The patient was seen for a total of 40 minutes in face-to-face genetic counseling.  This patient was discussed with Drs. Magrinat, Lindi Adie and/or Burr Medico who agrees with the above.   _______________________________________________________________________ For Office Staff:  Number of people involved in session: 1 Was an Intern/  student involved with case: no

## 2021-09-14 ENCOUNTER — Ambulatory Visit (HOSPITAL_BASED_OUTPATIENT_CLINIC_OR_DEPARTMENT_OTHER): Payer: Commercial Managed Care - PPO | Admitting: Obstetrics & Gynecology

## 2021-09-23 ENCOUNTER — Ambulatory Visit (INDEPENDENT_AMBULATORY_CARE_PROVIDER_SITE_OTHER): Payer: BC Managed Care – PPO | Admitting: Obstetrics & Gynecology

## 2021-09-23 ENCOUNTER — Other Ambulatory Visit: Payer: Self-pay

## 2021-09-23 ENCOUNTER — Encounter (HOSPITAL_BASED_OUTPATIENT_CLINIC_OR_DEPARTMENT_OTHER): Payer: Self-pay | Admitting: Obstetrics & Gynecology

## 2021-09-23 VITALS — BP 147/74 | HR 59 | Ht 67.0 in | Wt 149.0 lb

## 2021-09-23 DIAGNOSIS — Z01419 Encounter for gynecological examination (general) (routine) without abnormal findings: Secondary | ICD-10-CM | POA: Diagnosis not present

## 2021-09-23 DIAGNOSIS — Z780 Asymptomatic menopausal state: Secondary | ICD-10-CM

## 2021-09-23 DIAGNOSIS — N870 Mild cervical dysplasia: Secondary | ICD-10-CM

## 2021-09-23 DIAGNOSIS — Z8041 Family history of malignant neoplasm of ovary: Secondary | ICD-10-CM

## 2021-09-23 DIAGNOSIS — Z8619 Personal history of other infectious and parasitic diseases: Secondary | ICD-10-CM

## 2021-09-23 NOTE — Progress Notes (Signed)
60 y.o. G51P2002 Married White or Caucasian female here for annual exam.  Denies vaginal bleeding.  Oldest son and his family have been living with them.  They close on a house today.    H/o optic nerve meningioma.  Vision is stable.  Went to genetic testing last year.  Decided not to proceed at that time.  Aware she can change her mind at that time.    Patient's last menstrual period was 08/14/2014 (approximate).          Sexually active: Yes.    The current method of family planning is vasectomy.     The pregnancy intention screening data noted above was reviewed. Potential methods of contraception were discussed. The patient elected to proceed with No data recorded.  Exercising: "not as much" Smoker:  no  Health Maintenance: Pap:  09/11/2020 Negative History of abnormal Pap:  remote hx MMG:  01/29/2021 Negative (in Care Everywhere) Colonoscopy:  12/03/2012 BMD:   01/01/2014.  Plan to repeat this year. Screening Labs: done 09/22/2021   reports that she quit smoking about 26 years ago. Her smoking use included cigarettes. She has a 3.50 pack-year smoking history. She has never used smokeless tobacco. She reports that she does not currently use alcohol. She reports that she does not use drugs.  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 Darius Bump, [facility]   Radiation retinopathy 2020   Seasonal allergies     Past Surgical History:  Procedure Laterality Date   TONSILLECTOMY  1977   wisdom teeth removal  1984    Current Outpatient Medications  Medication Sig Dispense Refill   ascorbic acid (VITAMIN C) 1000 MG tablet Take by mouth as needed.     rosuvastatin (CRESTOR) 5 MG tablet Take 5 mg by mouth daily.     SUMAtriptan (IMITREX) 100 MG tablet Take 100 mg by mouth daily as needed.  11   VITAMIN D PO Take  5,000 Int'l Units by mouth daily.     Turmeric 400 MG CAPS Take by mouth. (Patient not taking: Reported on 09/23/2021)     No current facility-administered medications for this visit.    Family History  Problem Relation Age of Onset   Heart disease Father    Lung cancer Father 64   Osteoporosis Mother    Diverticulitis Brother    Ovarian cancer Paternal Aunt        dx 43s   Ovarian cancer Paternal Aunt        dx 70s   Esophageal cancer Maternal Grandfather        dx late 60s-early 70s   Lung cancer Paternal Grandfather        d. > 28   Cancer Cousin        retinal   Dementia Paternal Uncle        d. 38    Review of Systems  All other systems reviewed and are negative.  Exam:   BP (!) 147/74 (BP Location: Left Arm, Patient Position: Sitting, Cuff Size: Normal)   Pulse (!) 59   Ht 5\' 7"  (1.702 m) Comment: reported  Wt 149 lb (67.6 kg)   LMP 08/14/2014 (Approximate)   BMI 23.34 kg/m   Height: 5\' 7"  (170.2 cm) (reported)  General appearance: alert, cooperative and appears stated age Head: Normocephalic,  without obvious abnormality, atraumatic Neck: no adenopathy, supple, symmetrical, trachea midline and thyroid normal to inspection and palpation Lungs: clear to auscultation bilaterally Breasts: normal appearance, no masses or tenderness Heart: regular rate and rhythm Abdomen: soft, non-tender; bowel sounds normal; no masses,  no organomegaly Extremities: extremities normal, atraumatic, no cyanosis or edema Skin: Skin color, texture, turgor normal. No rashes or lesions Lymph nodes: Cervical, supraclavicular, and axillary nodes normal. No abnormal inguinal nodes palpated Neurologic: Grossly normal   Pelvic: External genitalia:  no lesions              Urethra:  normal appearing urethra with no masses, tenderness or lesions              Bartholins and Skenes: normal                 Vagina: normal appearing vagina with normal color and no discharge, no lesions               Cervix: no lesions              Pap taken: No. Bimanual Exam:  Uterus:  normal size, contour, position, consistency, mobility, non-tender              Adnexa: normal adnexa and no mass, fullness, tenderness               Rectovaginal: Confirms               Anus:  normal sphincter tone, no lesions  Chaperone, Octaviano Batty, CMA, was present for exam.  Assessment/Plan: 1. Well woman exam with routine gynecological exam - pap neg with HR HPV - MMG up to date - colonoscopy due 2024 - plan BMD with next MMG - care gaps reviewed/update  2. Postmenopausal - no HRT  3. Family history of ovarian cancer in maternal aunts (who were twins) - saw genetic counseling last year.  Declined having this done. - ultrasound discussed.  Declines for now.  4. History of HPV infection and dysplasia of cervix, low grade (CIN 1), 2017 - negative HR HPV testing 2018, 2019, and last year.  Not obtained today.

## 2022-10-13 ENCOUNTER — Encounter (HOSPITAL_BASED_OUTPATIENT_CLINIC_OR_DEPARTMENT_OTHER): Payer: Self-pay | Admitting: Obstetrics & Gynecology

## 2022-10-13 ENCOUNTER — Ambulatory Visit (INDEPENDENT_AMBULATORY_CARE_PROVIDER_SITE_OTHER): Payer: BC Managed Care – PPO | Admitting: Obstetrics & Gynecology

## 2022-10-13 VITALS — BP 126/67 | HR 63 | Ht 67.0 in | Wt 147.2 lb

## 2022-10-13 DIAGNOSIS — M810 Age-related osteoporosis without current pathological fracture: Secondary | ICD-10-CM

## 2022-10-13 DIAGNOSIS — Z01419 Encounter for gynecological examination (general) (routine) without abnormal findings: Secondary | ICD-10-CM | POA: Diagnosis not present

## 2022-10-13 DIAGNOSIS — Z780 Asymptomatic menopausal state: Secondary | ICD-10-CM | POA: Diagnosis not present

## 2022-10-13 DIAGNOSIS — Z8041 Family history of malignant neoplasm of ovary: Secondary | ICD-10-CM | POA: Diagnosis not present

## 2022-10-13 DIAGNOSIS — Z87410 Personal history of cervical dysplasia: Secondary | ICD-10-CM

## 2022-10-13 NOTE — Progress Notes (Signed)
61 y.o. G26P2002 Married White or Caucasian female here for annual exam.  Had an avulsion fracture ankle on left.  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  About three weeks ago had severe tooth pain.  Had cracked tooth.  Tooth has been pulled.  Now will have to have an implant placed next.    Denies vaginal bleeding.    H/o optic nerve meningioma.  Vision is stable.  Having issues with insurance covering MRI this year.  Patient's last menstrual period was 08/14/2014 (approximate).          Sexually active: Yes.    The current method of family planning is post menopausal status.    Smoker:  no  Health Maintenance: Pap:  09/11/2020 Negative History of abnormal Pap:  remote hx MMG:  09/2022 Colonoscopy:  12/03/2012, follow up 10 years.  Pt aware due next eyar BMD:  09/2022 osteoporosis.  Is on Vit D.  Will see specialist in 2024.  Has been referred. Screening Labs: done 06/2022   reports that she quit smoking about 27 years ago. Her smoking use included cigarettes. She has a 3.50 pack-year smoking history. She has never used smokeless tobacco. She reports that she does not currently use alcohol. She reports that she does not use drugs.  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 Darius Bump, [facility]   Radiation retinopathy 2020   Seasonal allergies     Past Surgical History:  Procedure Laterality Date   TONSILLECTOMY  1977   wisdom teeth removal  1984    Current Outpatient Medications  Medication Sig Dispense Refill   ascorbic acid (VITAMIN C) 1000 MG tablet Take by mouth as needed.     rosuvastatin (CRESTOR) 5 MG tablet Take 5 mg by mouth daily.     SUMAtriptan (IMITREX) 100 MG tablet Take 100 mg by mouth daily as needed.  11   Turmeric 400 MG CAPS Take by mouth.     VITAMIN D PO Take  5,000 Int'l Units by mouth daily.     No current facility-administered medications for this visit.    Family History  Problem Relation Age of Onset   Heart disease Father    Lung cancer Father 63   Osteoporosis Mother    Diverticulitis Brother    Ovarian cancer Paternal Aunt        dx 13s   Ovarian cancer Paternal Aunt        dx 68s   Esophageal cancer Maternal Grandfather        dx late 60s-early 70s   Lung cancer Paternal Grandfather        d. > 5   Cancer Cousin        retinal   Dementia Paternal Uncle        d. 2    ROS: Constitutional: negative Genitourinary:negative  Exam:   BP 126/67 (BP Location: Right Arm, Patient Position: Sitting, Cuff Size: Normal)   Pulse 63   Ht '5\' 7"'$  (1.702 m) Comment: reported  Wt 147 lb 3.2 oz (66.8 kg)   LMP 08/14/2014 (Approximate)   BMI 23.05 kg/m   Height: '5\' 7"'$  (170.2 cm) (reported)  General appearance: alert, cooperative and appears stated age Head: Normocephalic, without obvious abnormality, atraumatic Neck: no adenopathy, supple, symmetrical, trachea  midline and thyroid normal to inspection and palpation Lungs: clear to auscultation bilaterally Breasts: normal appearance, no masses or tenderness Heart: regular rate and rhythm Abdomen: soft, non-tender; bowel sounds normal; no masses,  no organomegaly Extremities: extremities normal, atraumatic, no cyanosis or edema Skin: Skin color, texture, turgor normal. No rashes or lesions Lymph nodes: Cervical, supraclavicular, and axillary nodes normal. No abnormal inguinal nodes palpated Neurologic: Grossly normal   Pelvic: External genitalia:  no lesions              Urethra:  normal appearing urethra with no masses, tenderness or lesions              Bartholins and Skenes: normal                 Vagina: normal appearing vagina with normal color and no discharge, no lesions              Cervix: no lesions              Pap taken: No. Bimanual Exam:  Uterus:  normal size,  contour, position, consistency, mobility, non-tender              Adnexa: normal adnexa and no mass, fullness, tenderness               Rectovaginal: Confirms               Anus:  normal sphincter tone, no lesions  Chaperone, Octaviano Batty, CMA, was present for exam.  Assessment/Plan: 1. Well woman exam with routine gynecological exam - Pap smear 2021.  Will repeat next year. - Mammogram 09/2022 - Colonoscopy 2014.  Follow up 10 years.  Pt aware due next year. - Bone mineral density 09/2022 showing osteoporosis - lab work done with PCP, Dr. Fayrene Fearing office - vaccines reviewed/updated  2. Age-related osteoporosis without current pathological fracture - is going to see specialist in 2024.  Has been referred  3. Family history of ovarian cancer - did go for genetic testing but declined to proceed at that time.  Discussed again today.    4. Postmenopausal - no HRT  5. History of cervical dysplasia - 2017.  With normal in 2018, 2019 and 2021.  Will repeat next year.

## 2022-12-20 ENCOUNTER — Encounter: Payer: Self-pay | Admitting: Internal Medicine

## 2023-01-13 ENCOUNTER — Ambulatory Visit (AMBULATORY_SURGERY_CENTER): Payer: BC Managed Care – PPO

## 2023-01-13 ENCOUNTER — Encounter: Payer: Self-pay | Admitting: Internal Medicine

## 2023-01-13 VITALS — Ht 67.0 in | Wt 146.0 lb

## 2023-01-13 DIAGNOSIS — Z1211 Encounter for screening for malignant neoplasm of colon: Secondary | ICD-10-CM

## 2023-01-13 DIAGNOSIS — M810 Age-related osteoporosis without current pathological fracture: Secondary | ICD-10-CM | POA: Insufficient documentation

## 2023-01-13 MED ORDER — NA SULFATE-K SULFATE-MG SULF 17.5-3.13-1.6 GM/177ML PO SOLN: 1.0000 | Freq: Once | ORAL | 0 refills | Status: AC

## 2023-01-13 NOTE — Progress Notes (Signed)
No egg or soy allergy known to patient  Hx of PONV  Patient denies ever being told they had issues or difficulty with intubation  No FH of Malignant Hyperthermia Pt is not on diet pills Pt is not on  home 02  Pt is not on blood thinners  Pt denies issues with constipation  No A fib or A flutter Have any cardiac testing pending--no Pt instructed to use Singlecare.com or GoodRx for a price reduction on prep   Patient's chart reviewed by Osvaldo Angst CNRA prior to previsit and patient appropriate for the [facility].  Previsit completed and red dot placed by patient's name on their procedure day (on provider's schedule).

## 2023-01-27 ENCOUNTER — Encounter: Payer: Self-pay | Admitting: Internal Medicine

## 2023-01-27 ENCOUNTER — Ambulatory Visit (AMBULATORY_SURGERY_CENTER): Payer: BC Managed Care – PPO | Admitting: Internal Medicine

## 2023-01-27 VITALS — BP 135/72 | HR 67 | Temp 97.1°F | Resp 15 | Ht 67.0 in | Wt 147.0 lb

## 2023-01-27 DIAGNOSIS — Z1211 Encounter for screening for malignant neoplasm of colon: Secondary | ICD-10-CM | POA: Diagnosis present

## 2023-01-27 MED ORDER — SODIUM CHLORIDE 0.9 % IV SOLN: 500.0000 mL | Freq: Once | INTRAVENOUS | Status: DC

## 2023-01-27 NOTE — Progress Notes (Signed)
A and O x3. Report to RN. Tolerated MAC anesthesia well. 

## 2023-01-27 NOTE — Progress Notes (Signed)
HISTORY OF PRESENT ILLNESS:  Suzanne Carter is a 62 y.o. female who presents today for routine colon cancer screening.  Index examination 2004 was normal.  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 Darius Bump, [facility]   Radiation retinopathy 2020   Retinal micro-aneurysm of left eye    Seasonal allergies     Past Surgical History:  Procedure Laterality Date   COLONOSCOPY  2014   EYE SURGERY  09/2016   left optic shealth menginoma biospy   TONSILLECTOMY  11/15/1975   wisdom teeth removal  11/14/1982    Social History Suzanne Carter  reports that she quit smoking about 28 years ago. Her smoking use included cigarettes. She has a 3.50 pack-year smoking history. She has never used smokeless tobacco. She reports that she does not currently use alcohol. She reports that she does not use drugs.  family history includes Cancer in her cousin; Dementia in her paternal uncle; Diverticulitis in her brother; Esophageal cancer in her maternal grandfather; Heart disease in her father; Lung cancer in her paternal grandfather; Lung cancer (age of onset: 75) in her father; Osteoporosis in her mother; Ovarian cancer in her paternal aunt and paternal aunt.  No Known Allergies     PHYSICAL EXAMINATION: Vital signs: BP 112/65   Pulse 60   Temp (!) 97.1 F (36.2 C) (Temporal)   Ht 5\' 7"  (1.702 m)   Wt 147 lb (66.7 kg)   LMP 08/14/2014 (Approximate)   SpO2 100%   BMI 23.02 kg/m  General: Well-developed, well-nourished, no acute distress HEENT: Sclerae are anicteric, conjunctiva pink. Oral mucosa intact Lungs: Clear Heart: Regular Abdomen: soft, nontender, nondistended, no obvious  ascites, no peritoneal signs, normal bowel sounds. No organomegaly. Extremities: No edema Psychiatric: alert and oriented x3. Cooperative     ASSESSMENT:  Colon cancer screening   PLAN:  Screening colonoscopy

## 2023-01-27 NOTE — Progress Notes (Signed)
VS completed by EC.   Pt's states no medical or surgical changes since previsit or office visit.  

## 2023-01-27 NOTE — Op Note (Signed)
[facility] Patient Name: Suzanne Carter Procedure Date: 01/27/2023 1:24 PM MRN: [id number] Endoscopist: Docia Chuck. Henrene Pastor , MD, [id number] Age: 62 Referring MD:  Date of Birth: December 10, 1960 Gender: Female Account #: [id number] Procedure:                Colonoscopy Indications:              Screening for colorectal malignant neoplasm. Normal                            index exam 2014 Medicines:                Monitored Anesthesia Care Procedure:                Pre-Anesthesia Assessment:                           - Prior to the procedure, a History and Physical                            was performed, and patient medications and                            allergies were reviewed. The patient's tolerance of                            previous anesthesia was also reviewed. The risks                            and benefits of the procedure and the sedation                            options and risks were discussed with the patient.                            All questions were answered, and informed consent                            was obtained. Prior Anticoagulants: The patient has                            taken no anticoagulant or antiplatelet agents. ASA                            Grade Assessment: II - A patient with mild systemic                            disease. After reviewing the risks and benefits,                            the patient was deemed in satisfactory condition to                            undergo the procedure.  After obtaining informed consent, the colonoscope                            was passed under direct vision. Throughout the                            procedure, the patient's blood pressure, pulse, and                            oxygen saturations were monitored continuously. The                            CF [id number] was introduced through the anus                            and advanced to the the cecum,  identified by                            appendiceal orifice and ileocecal valve. The                            ileocecal valve, appendiceal orifice, and rectum                            were photographed. The quality of the bowel                            preparation was excellent. The colonoscopy was                            performed without difficulty. The patient tolerated                            the procedure well. The bowel preparation used was                            SUPREP via split dose instruction. Scope In: 1:28:42 PM Scope Out: 1:41:01 PM Scope Withdrawal Time: 0 hours 8 minutes 42 seconds  Total Procedure Duration: 0 hours 12 minutes 19 seconds  Findings:                 The entire examined colon appeared normal on direct                            and retroflexion views. Complications:            No immediate complications. Estimated blood loss:                            None. Estimated Blood Loss:     Estimated blood loss: none. Impression:               - The entire examined colon is normal on direct and  retroflexion views.                           - No specimens collected. Recommendation:           - Repeat colonoscopy in 10 years for screening                            purposes.                           - Patient has a contact number available for                            emergencies. The signs and symptoms of potential                            delayed complications were discussed with the                            patient. Return to normal activities tomorrow.                            Written discharge instructions were provided to the                            patient.                           - Resume previous diet.                           - Continue present medications. Docia Chuck. Henrene Pastor, MD 01/27/2023 1:47:37 PM This report has been signed electronically.

## 2023-01-27 NOTE — Patient Instructions (Signed)
-  repeat colonoscopy in10 years for surveillance recommended.    YOU HAD AN ENDOSCOPIC PROCEDURE TODAY AT [facility]:   Refer to the procedure report that was given to you for any specific questions about what was found during the examination.  If the procedure report does not answer your questions, please call your gastroenterologist to clarify.  If you requested that your care partner not be given the details of your procedure findings, then the procedure report has been included in a sealed envelope for you to review at your convenience later.  YOU SHOULD EXPECT: Some feelings of bloating in the abdomen. Passage of more gas than usual.  Walking can help get rid of the air that was put into your GI tract during the procedure and reduce the bloating. If you had a lower endoscopy (such as a colonoscopy or flexible sigmoidoscopy) you may notice spotting of blood in your stool or on the toilet paper. If you underwent a bowel prep for your procedure, you may not have a normal bowel movement for a few days.  Please Note:  You might notice some irritation and congestion in your nose or some drainage.  This is from the oxygen used during your procedure.  There is no need for concern and it should clear up in a day or so.  SYMPTOMS TO REPORT IMMEDIATELY:  Following lower endoscopy (colonoscopy or flexible sigmoidoscopy):  Excessive amounts of blood in the stool  Significant tenderness or worsening of abdominal pains  Swelling of the abdomen that is new, acute  Fever of 100F or higher  For urgent or emergent issues, a gastroenterologist can be reached at any hour by calling [phone]. Do not use MyChart messaging for urgent concerns.    DIET:  We do recommend a small meal at first, but then you may proceed to your regular diet.  Drink plenty of fluids but you should avoid alcoholic beverages for 24 hours.  ACTIVITY:  You should plan to take it easy for the rest of today and you  should NOT DRIVE or use heavy machinery until tomorrow (because of the sedation medicines used during the test).    FOLLOW UP: Our staff will call the number listed on your records the next business day following your procedure.  We will call around 7:15- 8:00 am to check on you and address any questions or concerns that you may have regarding the information given to you following your procedure. If we do not reach you, we will leave a message.     If any biopsies were taken you will be contacted by phone or by letter within the next 1-3 weeks.  Please call us at [phone] if you have not heard about the biopsies in 3 weeks.    SIGNATURES/CONFIDENTIALITY: You and/or your care partner have signed paperwork which will be entered into your electronic medical record.  These signatures attest to the fact that that the information above on your After Visit Summary has been reviewed and is understood.  Full responsibility of the confidentiality of this discharge information lies with you and/or your care-partner.

## 2023-01-30 ENCOUNTER — Telehealth: Payer: Self-pay | Admitting: *Deleted

## 2023-01-30 NOTE — Telephone Encounter (Signed)
  Follow up Call-     01/27/2023   12:38 PM  Call back number  Post procedure Call Back phone  # [phone]  Permission to leave phone message Yes     Patient questions:  Do you have a fever, pain , or abdominal swelling? No. Pain Score  0 *  Have you tolerated food without any problems? Yes.    Have you been able to return to your normal activities? Yes.    Do you have any questions about your discharge instructions: Diet   No. Medications  No. Follow up visit  No.  Do you have questions or concerns about your Care? No.  Actions: * If pain score is 4 or above: No action needed, pain <4.

## 2023-10-27 ENCOUNTER — Ambulatory Visit (HOSPITAL_BASED_OUTPATIENT_CLINIC_OR_DEPARTMENT_OTHER): Payer: BC Managed Care – PPO | Admitting: Obstetrics & Gynecology

## 2023-10-30 NOTE — Progress Notes (Signed)
62 y.o. G5P2002 Married White or Caucasian female here for annual exam.  Doing well.  Denies vaginal bleeding.  Has lipoma removal scheduled for January.  Has retired from work.  H/o optic nerve meningioma.  Having follow up at [facility].  Sees retinal specialist and optic nerve specialist as well.  Also saw radiation oncology for a while as well but these appointments have decreased in frequency.    Having issues with diarrhea.  Has seen GI and had colonoscopy this year.    Has been diagnosed with osteoporosis.  Saw ortho provider.  Did reclast x 1  Patient's last menstrual period was 08/14/2014 (approximate).          Sexually active: Yes.    The current method of family planning is vasectomy.     Health Maintenance: Pap:  09/11/2020 Negative History of abnormal Pap:  remote hx MMG:  09/2022 Negative Colonoscopy:  01/27/2023 BMD:   09/29/2022 Screening Labs: Does with PCP   reports that she quit smoking about 28 years ago. Her smoking use included cigarettes. She started smoking about 42 years ago. She has a 3.5 pack-year smoking history. She has never used smokeless tobacco. She reports that she does not currently use alcohol. She reports that she does not use drugs.  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 Edgar Frisk, [facility]   Radiation retinopathy 2020   Retinal micro-aneurysm of left eye    Seasonal allergies     Past Surgical History:  Procedure Laterality Date   COLONOSCOPY  2014   EYE SURGERY  09/2016   left optic shealth menginoma biospy   TONSILLECTOMY  11/15/1975   wisdom teeth removal  11/14/1982    Current Outpatient Medications  Medication Sig Dispense Refill   ascorbic acid (VITAMIN C) 1000 MG tablet Take 1,000 mg by  mouth as needed (in the winter).     Co-Enzyme Q10 100 MG CAPS Take 100 mg by mouth daily.     OVER THE COUNTER MEDICATION Take 3,000 mg by mouth daily. Grass fed bone and marrow     OVER THE COUNTER MEDICATION Take 1,500 mg by mouth daily. Grass fed beef liver     rosuvastatin (CRESTOR) 5 MG tablet Take 5 mg by mouth daily.     SUMAtriptan (IMITREX) 100 MG tablet Take 100 mg by mouth daily as needed.  11   Turmeric 400 MG CAPS Take by mouth.     TURMERIC PO Take 1,500 mg by mouth daily.     VITAMIN D PO Take 6,000 Int'l Units by mouth daily.     zoledronic acid (RECLAST) 5 MG/100ML SOLN injection Inject 5 mg into the vein.     No current facility-administered medications for this visit.    Family History  Problem Relation Age of Onset   Osteoporosis Mother    Heart disease Father    Lung cancer Father 89   Diverticulitis Brother    Ovarian cancer Paternal Aunt        dx 54s   Ovarian cancer Paternal Aunt        dx 1s   Dementia Paternal Uncle        d. 57   Esophageal cancer Maternal Grandfather  dx late 60s-early 70s   Lung cancer Paternal Grandfather        d. > 50   Cancer Cousin        retinal   Colon cancer Neg Hx    Colon polyps Neg Hx    Rectal cancer Neg Hx    Stomach cancer Neg Hx     ROS: Constitutional: negative Genitourinary:negative  Exam:   BP 105/84 (BP Location: Right Arm, Patient Position: Sitting, Cuff Size: Normal)   Pulse 61   Ht 5\' 7"  (1.702 m)   Wt 154 lb 6.4 oz (70 kg)   LMP 08/14/2014 (Approximate)   BMI 24.18 kg/m   Height: 5\' 7"  (170.2 cm)  General appearance: alert, cooperative and appears stated age Head: Normocephalic, without obvious abnormality, atraumatic Neck: no adenopathy, supple, symmetrical, trachea midline and thyroid normal to inspection and palpation Lungs: clear to auscultation bilaterally Breasts: normal appearance, no masses or tenderness Heart: regular rate and rhythm Abdomen: soft, non-tender; bowel sounds  normal; no masses,  no organomegaly Extremities: extremities normal, atraumatic, no cyanosis or edema Skin: Skin color, texture, turgor normal. No rashes or lesions Lymph nodes: Cervical, supraclavicular, and axillary nodes normal. No abnormal inguinal nodes palpated Neurologic: Grossly normal   Pelvic: External genitalia:  no lesions              Urethra:  normal appearing urethra with no masses, tenderness or lesions              Bartholins and Skenes: normal                 Vagina: normal appearing vagina with normal color and no discharge, no lesions              Cervix: no lesions              Pap taken: Yes.   Bimanual Exam:  Uterus:  normal size, contour, position, consistency, mobility, non-tender              Adnexa: normal adnexa and no mass, fullness, tenderness               Rectovaginal: Confirms               Anus:  normal sphincter tone, no lesions  Chaperone, Ina Homes, CMA, was present for exam.  Assessment/Plan: 1. Well woman exam with routine gynecological exam (Primary) - Pap smear obtained with HR HPV - Mammogram 09/2022 - Colonoscopy 2024 - Bone mineral density 09/2022 - lab work done with PCP - vaccines reviewed/updated  2. Cervical cancer screening - Cytology - PAP( [place])  3. Primary meningioma of optic nerve sheath (HCC) - followed at [facility]  4. Family history of ovarian cancer - did see genetic counseling in the past.  Declined testing then. - ultrasound discussed again.   5. Age-related osteoporosis without current pathological fracture - is followed by orthopedics   6. History of HPV infection - in 2017.  Negative testing in 2018, 2019, and 2020

## 2023-10-31 ENCOUNTER — Other Ambulatory Visit (HOSPITAL_COMMUNITY)
Admission: RE | Admit: 2023-10-31 | Discharge: 2023-10-31 | Disposition: A | Discharge status: Home / Self Care | Payer: BC Managed Care – PPO | Source: Ambulatory Visit | Attending: Obstetrics & Gynecology | Admitting: Obstetrics & Gynecology

## 2023-10-31 ENCOUNTER — Encounter (HOSPITAL_BASED_OUTPATIENT_CLINIC_OR_DEPARTMENT_OTHER): Payer: Self-pay | Admitting: Obstetrics & Gynecology

## 2023-10-31 ENCOUNTER — Ambulatory Visit (HOSPITAL_BASED_OUTPATIENT_CLINIC_OR_DEPARTMENT_OTHER): Payer: BC Managed Care – PPO | Admitting: Obstetrics & Gynecology

## 2023-10-31 VITALS — BP 105/84 | HR 61 | Ht 67.0 in | Wt 154.4 lb

## 2023-10-31 DIAGNOSIS — M810 Age-related osteoporosis without current pathological fracture: Secondary | ICD-10-CM

## 2023-10-31 DIAGNOSIS — Z124 Encounter for screening for malignant neoplasm of cervix: Secondary | ICD-10-CM | POA: Insufficient documentation

## 2023-10-31 DIAGNOSIS — Z8619 Personal history of other infectious and parasitic diseases: Secondary | ICD-10-CM

## 2023-10-31 DIAGNOSIS — Z01419 Encounter for gynecological examination (general) (routine) without abnormal findings: Secondary | ICD-10-CM

## 2023-10-31 DIAGNOSIS — Z8041 Family history of malignant neoplasm of ovary: Secondary | ICD-10-CM

## 2023-10-31 DIAGNOSIS — D320 Benign neoplasm of cerebral meninges: Secondary | ICD-10-CM

## 2023-11-02 LAB — CYTOLOGY - PAP
Comment: NEGATIVE
Diagnosis: UNDETERMINED — AB
High risk HPV: NEGATIVE

## 2023-11-03 ENCOUNTER — Ambulatory Visit (HOSPITAL_BASED_OUTPATIENT_CLINIC_OR_DEPARTMENT_OTHER): Payer: BC Managed Care – PPO | Admitting: Obstetrics & Gynecology

## 2023-11-08 ENCOUNTER — Encounter (HOSPITAL_BASED_OUTPATIENT_CLINIC_OR_DEPARTMENT_OTHER): Payer: Self-pay | Admitting: Obstetrics & Gynecology

## 2023-12-11 ENCOUNTER — Encounter (HOSPITAL_BASED_OUTPATIENT_CLINIC_OR_DEPARTMENT_OTHER): Payer: Self-pay | Admitting: Obstetrics & Gynecology

## 2023-12-11 ENCOUNTER — Ambulatory Visit (HOSPITAL_BASED_OUTPATIENT_CLINIC_OR_DEPARTMENT_OTHER): Payer: BC Managed Care – PPO

## 2023-12-11 ENCOUNTER — Ambulatory Visit (HOSPITAL_BASED_OUTPATIENT_CLINIC_OR_DEPARTMENT_OTHER): Payer: BC Managed Care – PPO | Admitting: Obstetrics & Gynecology

## 2023-12-11 VITALS — BP 133/64 | HR 51 | Ht 67.0 in | Wt 155.0 lb

## 2023-12-11 DIAGNOSIS — Z8041 Family history of malignant neoplasm of ovary: Secondary | ICD-10-CM

## 2023-12-11 DIAGNOSIS — R194 Change in bowel habit: Secondary | ICD-10-CM

## 2023-12-11 DIAGNOSIS — Z780 Asymptomatic menopausal state: Secondary | ICD-10-CM | POA: Diagnosis not present

## 2023-12-11 NOTE — Progress Notes (Unsigned)
GYNECOLOGY  VISIT  CC:   Discuss ultrasound results, ***  HPI: 63 y.o. G98P2002 Married White or Caucasian female here for discussion of ultrasound results.  Ultrasound obtained due to ***.   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 Edgar Frisk, [facility]   Radiation retinopathy 2020   Retinal micro-aneurysm of left eye    Seasonal allergies     MEDS:   Current Outpatient Medications on File Prior to Visit  Medication Sig Dispense Refill   ascorbic acid (VITAMIN C) 1000 MG tablet Take 1,000 mg by mouth as needed (in the winter).     Co-Enzyme Q10 100 MG CAPS Take 100 mg by mouth daily.     OVER THE COUNTER MEDICATION Take 3,000 mg by mouth daily. Grass fed bone and marrow     OVER THE COUNTER MEDICATION Take 1,500 mg by mouth daily. Grass fed beef liver     rosuvastatin (CRESTOR) 5 MG tablet Take 5 mg by mouth daily.     SUMAtriptan (IMITREX) 100 MG tablet Take 100 mg by mouth daily as needed.  11   Turmeric 400 MG CAPS Take by mouth.     TURMERIC PO Take 1,500 mg by mouth daily.     VITAMIN D PO Take 6,000 Int'l Units by mouth daily.     zoledronic acid (RECLAST) 5 MG/100ML SOLN injection Inject 5 mg into the vein.     No current facility-administered medications on file prior to visit.    ALLERGIES: Patient has no known allergies.  SH:  ***  ROS  PHYSICAL EXAMINATION:    LMP 08/14/2014 (Approximate)      Physical Exam  ***Chaperone present for exam.  Assessment/Plan: 1. Family history of ovarian cancer *** - US PELVIS TRANSVAGINAL NON-OB (TV ONLY)

## 2023-12-12 NOTE — Progress Notes (Signed)
GYNECOLOGY  VISIT  CC:   Discuss ultrasound results, family history of ovarian cancer, h/o bowel changes last year  HPI: 63 y.o. G73P2002 Married White or Caucasian female here for discussion of ultrasound results.  Ultrasound obtained due to family hx of ovarian cancer and changes with GI function last year.  Did see GI last year and had updated colonoscopy.  Ultrasound today showed normal uterus.  Endometrium of around 2.69mm.  Ovaries were atrophic and normal in appearance.  No free fluid.  No adnexal masses.  Cervix normal as well.    Pt has seen genetic counselor and decided not proceed with genetic testing.  Received the mychart communication last year about Gene Connects and has questions about it.  Discussed this project and goal of improvement in population health.  Discussed differences with Gene Connects and full genetic testing.  She is considering doing Gene Connnects and does understand this would not test for as many gene mutations at full genetic testing.  Has contact information for this testing and if decided to proceed with full genetic testing, would refer back to genetic counseling at the [facility].     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 Edgar Frisk, [facility]   Radiation retinopathy 2020   Retinal micro-aneurysm of left eye    Seasonal allergies     MEDS:   Current Outpatient Medications on File Prior to Visit  Medication Sig Dispense Refill   ascorbic acid (VITAMIN C) 1000 MG tablet Take 1,000 mg by mouth as needed (in the winter).     Co-Enzyme Q10 100 MG CAPS Take 100 mg by mouth daily.     OVER THE COUNTER MEDICATION Take 3,000 mg by mouth daily. Grass fed bone and marrow     OVER THE COUNTER MEDICATION Take 1,500 mg by  mouth daily. Grass fed beef liver     rosuvastatin (CRESTOR) 5 MG tablet Take 5 mg by mouth daily.     SUMAtriptan (IMITREX) 100 MG tablet Take 100 mg by mouth daily as needed.  11   Turmeric 400 MG CAPS Take by mouth.     TURMERIC PO Take 1,500 mg by mouth daily.     VITAMIN D PO Take 6,000 Int'l Units by mouth daily.     zoledronic acid (RECLAST) 5 MG/100ML SOLN injection Inject 5 mg into the vein.     No current facility-administered medications on file prior to visit.    ALLERGIES: Patient has no known allergies.  SH:  married, non smoker  Review of Systems  Genitourinary: Negative.     PHYSICAL EXAMINATION:    BP 133/64 (BP Location: Right Arm, Patient Position: Sitting, Cuff Size: Normal)   Pulse (!) 51   Ht 5\' 7"  (1.702 m) Comment: Reported  Wt 155 lb (70.3 kg)   LMP 08/14/2014 (Approximate)   BMI 24.28 kg/m      Physical Exam Constitutional:      Appearance: Normal appearance.  Neurological:     General: No focal deficit present.     Mental Status: She is alert.  Psychiatric:        Mood and Affect: Mood normal.     Assessment/Plan: 1. Family history of ovarian cancer (  Primary) - pt reassured by ultrasound today - considering Gene Connects study  2. Postmenopausal - not on HRT  3. Bowel habit changes - has seen Dr. Marina Goodell and had colonoscopy 01/2023.

## 2024-11-04 ENCOUNTER — Ambulatory Visit (INDEPENDENT_AMBULATORY_CARE_PROVIDER_SITE_OTHER): Payer: Self-pay | Admitting: Certified Nurse Midwife

## 2024-11-04 ENCOUNTER — Other Ambulatory Visit (HOSPITAL_COMMUNITY)
Admission: RE | Admit: 2024-11-04 | Discharge: 2024-11-04 | Disposition: A | Source: Ambulatory Visit | Attending: Certified Nurse Midwife | Admitting: Certified Nurse Midwife

## 2024-11-04 VITALS — BP 135/58 | HR 68 | Ht 67.0 in | Wt 155.4 lb

## 2024-11-04 DIAGNOSIS — Z124 Encounter for screening for malignant neoplasm of cervix: Secondary | ICD-10-CM | POA: Diagnosis present

## 2024-11-04 DIAGNOSIS — Z1151 Encounter for screening for human papillomavirus (HPV): Secondary | ICD-10-CM

## 2024-11-04 DIAGNOSIS — Z01419 Encounter for gynecological examination (general) (routine) without abnormal findings: Secondary | ICD-10-CM

## 2024-11-04 DIAGNOSIS — Z1331 Encounter for screening for depression: Secondary | ICD-10-CM

## 2024-11-04 NOTE — Progress Notes (Signed)
 " 63 y.o. G53P2002 Married White or Caucasian female here for annual exam.    Patient's last menstrual period was 08/14/2014.          Sexually active: No.  Exercising: Weight bearing and strength training exercise routinely   Smoker:  no ETOH: no  Health Maintenance: Pap:  Collected History of abnormal Pap:  ASCUS MMG:  Pt states mammogram UTD Colonoscopy:  UTD BMD:   UTD, repeat planned 2026 (Osteoporosis) Screening Labs: PCP   reports that she quit smoking about 30 years ago. Her smoking use included cigarettes. She started smoking about 44 years ago. She has a 3.5 pack-year smoking history. She has never used smokeless tobacco. She reports that she does not currently use alcohol. She reports that she does not use drugs.  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 Omar Dover, [facility]   Radiation retinopathy 2020   Retinal micro-aneurysm of left eye    Seasonal allergies     Past Surgical History:  Procedure Laterality Date   COLONOSCOPY  2014   EYE SURGERY  09/2016   left optic shealth menginoma biospy   TONSILLECTOMY  11/15/1975   wisdom teeth removal  11/14/1982    Current Outpatient Medications  Medication Sig Dispense Refill   ascorbic acid (VITAMIN C) 1000 MG tablet Take 1,000 mg by mouth as needed (in the winter).     Co-Enzyme Q10 100 MG CAPS Take 100 mg by mouth daily.     MAGNESIUM CITRATE PO Take 250 tablets by mouth daily.     rosuvastatin (CRESTOR) 5 MG tablet Take 5 mg by mouth daily.     SUMAtriptan (IMITREX) 100 MG tablet Take 100 mg by mouth daily as needed.  11   Turmeric 400 MG CAPS Take by mouth.     OVER THE COUNTER MEDICATION Take 3,000 mg by mouth daily. Grass fed bone and marrow (Patient not taking: Reported  on 11/04/2024)     OVER THE COUNTER MEDICATION Take 1,500 mg by mouth daily. Grass fed beef liver (Patient not taking: Reported on 11/04/2024)     TURMERIC PO Take 1,500 mg by mouth daily. (Patient not taking: Reported on 11/04/2024)     VITAMIN D PO Take 6,000 Int'l Units by mouth daily. (Patient not taking: Reported on 11/04/2024)     zoledronic acid (RECLAST) 5 MG/100ML SOLN injection Inject 5 mg into the vein. (Patient not taking: Reported on 11/04/2024)     No current facility-administered medications for this visit.    Family History  Problem Relation Age of Onset   Osteoporosis Mother    Heart disease Father    Lung cancer Father 14   Diverticulitis Brother    Ovarian cancer Paternal Aunt        dx 16s   Ovarian cancer Paternal Aunt        dx 72s   Dementia Paternal Uncle        d. 5   Esophageal cancer Maternal Grandfather        dx late 60s-early 70s   Lung cancer Paternal Grandfather        d. > 50   Cancer Cousin        retinal  Colon cancer Neg Hx    Colon polyps Neg Hx    Rectal cancer Neg Hx    Stomach cancer Neg Hx     ROS: Constitutional: negative Genitourinary:negative  Exam:   BP (!) 135/58 (BP Location: Right Arm, Patient Position: Sitting, Cuff Size: Small)   Pulse 68   Ht 5' 7 (1.702 m)   Wt 155 lb 6.4 oz (70.5 kg)   LMP 08/14/2014   BMI 24.34 kg/m   Height: 5' 7 (170.2 cm)  General appearance: alert, cooperative and appears stated age Head: Normocephalic, without obvious abnormality, atraumatic Lungs: clear to auscultation bilaterally Breasts: normal appearance, no masses or tenderness, Inspection negative, No nipple retraction or dimpling, No nipple discharge or bleeding, No axillary or supraclavicular adenopathy, Normal to palpation without dominant masses Heart: regular rate and rhythm Abdomen: soft, non-tender; bowel sounds normal; no masses,  no organomegaly Extremities: extremities normal, atraumatic, no cyanosis or edema Skin: Skin  color, texture, turgor normal. No rashes or lesions Lymph nodes: Cervical, supraclavicular, and axillary nodes normal. No abnormal inguinal nodes palpated Neurologic: Grossly normal   Pelvic: External genitalia:  no lesions              Urethra:  normal appearing urethra with no masses, tenderness or lesions              Bartholins and Skenes: normal                 Vagina: normal appearing vagina with normal color and no discharge, no lesions              Cervix: no bleeding following Pap, no cervical motion tenderness, and no lesions              Pap taken: Yes.   Bimanual Exam:  Uterus:  normal size, contour, position, consistency, mobility, non-tender              Adnexa: no mass, fullness, tenderness               Rectovaginal: Confirms               Anus:  normal sphincter tone, no lesions  Chaperone,  CMA, was present for exam.  Assessment/Plan:   1. Encounter for annual routine gynecological examination (Primary) - Continue annual screening mammograms and breast self awareness - Pt eating healthy well-balanced diet with adequate Vitamin D/Calcium - Continue weight bearing exercise - Vaccines UTD (Shingles/Tdap/Pneumonia/Flu) - Colonoscopy UTD - Pt sees Dermatology for skin cancer screenings  2. Cervical cancer screening - Cytology - PAP( [place])   3. Osteoporosis - Pt consuming adequate Calcium/Vitamin d and participates in routine weight bearing exercise - she plans a follow-up with Orthopedics in 2026  RTO 1 year for annual gyn exam and prn if issues arise. Arland POUR Shir Bergman  "

## 2024-11-06 LAB — CYTOLOGY - PAP
Comment: NEGATIVE
Diagnosis: NEGATIVE
High risk HPV: NEGATIVE

## 2024-11-12 ENCOUNTER — Ambulatory Visit (HOSPITAL_BASED_OUTPATIENT_CLINIC_OR_DEPARTMENT_OTHER): Payer: Self-pay | Admitting: Certified Nurse Midwife
